# Patient Record
Sex: Male | Born: 1965 | Race: White | Hispanic: No | Marital: Married | Smoking: Current every day smoker
Health system: Southern US, Community
[De-identification: ages and names within clinical notes are randomized; demographics above are authoritative.]

## PROBLEM LIST (undated history)

## (undated) DIAGNOSIS — F419 Anxiety disorder, unspecified: Secondary | ICD-10-CM

## (undated) DIAGNOSIS — K219 Gastro-esophageal reflux disease without esophagitis: Secondary | ICD-10-CM

## (undated) DIAGNOSIS — F32A Depression, unspecified: Secondary | ICD-10-CM

## (undated) DIAGNOSIS — I10 Essential (primary) hypertension: Secondary | ICD-10-CM

## (undated) DIAGNOSIS — E785 Hyperlipidemia, unspecified: Secondary | ICD-10-CM

## (undated) DIAGNOSIS — F329 Major depressive disorder, single episode, unspecified: Secondary | ICD-10-CM

## (undated) DIAGNOSIS — R45851 Suicidal ideations: Secondary | ICD-10-CM

## (undated) DIAGNOSIS — G894 Chronic pain syndrome: Secondary | ICD-10-CM

## (undated) HISTORY — DX: Chronic pain syndrome: G89.4

## (undated) HISTORY — DX: Anxiety disorder, unspecified: F41.9

## (undated) HISTORY — DX: Gastro-esophageal reflux disease without esophagitis: K21.9

## (undated) HISTORY — PX: OTHER SURGICAL HISTORY: SHX169

## (undated) HISTORY — DX: Major depressive disorder, single episode, unspecified: F32.9

## (undated) HISTORY — DX: Hyperlipidemia, unspecified: E78.5

## (undated) HISTORY — DX: Depression, unspecified: F32.A

## (undated) HISTORY — DX: Essential (primary) hypertension: I10

## (undated) HISTORY — DX: Suicidal ideations: R45.851

---

## 1998-11-01 ENCOUNTER — Ambulatory Visit (HOSPITAL_COMMUNITY): Admission: RE | Admit: 1998-11-01 | Discharge: 1998-11-01 | Payer: Self-pay | Admitting: Internal Medicine

## 1998-11-01 ENCOUNTER — Encounter: Payer: Self-pay | Admitting: Internal Medicine

## 1999-05-04 ENCOUNTER — Encounter: Payer: Self-pay | Admitting: *Deleted

## 1999-05-04 ENCOUNTER — Emergency Department (HOSPITAL_COMMUNITY): Admission: EM | Admit: 1999-05-04 | Discharge: 1999-05-04 | Payer: Self-pay | Admitting: Podiatry

## 2000-03-27 ENCOUNTER — Encounter: Payer: Self-pay | Admitting: Emergency Medicine

## 2000-03-27 ENCOUNTER — Inpatient Hospital Stay (HOSPITAL_COMMUNITY): Admission: EM | Admit: 2000-03-27 | Discharge: 2000-03-31 | Payer: Self-pay | Admitting: Emergency Medicine

## 2000-03-28 ENCOUNTER — Encounter: Payer: Self-pay | Admitting: Orthopedic Surgery

## 2000-04-09 ENCOUNTER — Ambulatory Visit (HOSPITAL_COMMUNITY): Admission: RE | Admit: 2000-04-09 | Discharge: 2000-04-09 | Payer: Self-pay | Admitting: Orthopedic Surgery

## 2000-10-04 ENCOUNTER — Encounter: Admission: RE | Admit: 2000-10-04 | Discharge: 2001-01-02 | Payer: Self-pay | Admitting: Anesthesiology

## 2000-11-01 ENCOUNTER — Encounter: Payer: Self-pay | Admitting: Internal Medicine

## 2000-11-01 ENCOUNTER — Ambulatory Visit (HOSPITAL_COMMUNITY): Admission: RE | Admit: 2000-11-01 | Discharge: 2000-11-01 | Payer: Self-pay | Admitting: Internal Medicine

## 2000-12-26 ENCOUNTER — Inpatient Hospital Stay (HOSPITAL_COMMUNITY): Admission: EM | Admit: 2000-12-26 | Discharge: 2000-12-29 | Payer: Self-pay | Admitting: *Deleted

## 2001-01-09 ENCOUNTER — Encounter: Admission: RE | Admit: 2001-01-09 | Discharge: 2001-01-19 | Payer: Self-pay | Admitting: Anesthesiology

## 2002-07-28 ENCOUNTER — Encounter: Payer: Self-pay | Admitting: Emergency Medicine

## 2002-07-28 ENCOUNTER — Emergency Department (HOSPITAL_COMMUNITY): Admission: EM | Admit: 2002-07-28 | Discharge: 2002-07-28 | Payer: Self-pay | Admitting: Emergency Medicine

## 2003-06-10 ENCOUNTER — Encounter: Admission: RE | Admit: 2003-06-10 | Discharge: 2003-09-08 | Payer: Self-pay | Admitting: Anesthesiology

## 2004-01-13 ENCOUNTER — Ambulatory Visit (HOSPITAL_COMMUNITY): Admission: RE | Admit: 2004-01-13 | Discharge: 2004-01-13 | Payer: Self-pay | Admitting: Family Medicine

## 2004-05-02 ENCOUNTER — Ambulatory Visit: Payer: Self-pay | Admitting: Internal Medicine

## 2004-06-20 ENCOUNTER — Ambulatory Visit: Payer: Self-pay | Admitting: Internal Medicine

## 2004-07-01 ENCOUNTER — Ambulatory Visit: Payer: Self-pay | Admitting: Gastroenterology

## 2004-07-20 ENCOUNTER — Ambulatory Visit: Payer: Self-pay | Admitting: Gastroenterology

## 2004-12-05 ENCOUNTER — Ambulatory Visit: Payer: Self-pay | Admitting: Internal Medicine

## 2004-12-29 ENCOUNTER — Ambulatory Visit (HOSPITAL_COMMUNITY): Admission: RE | Admit: 2004-12-29 | Discharge: 2004-12-29 | Payer: Self-pay | Admitting: Internal Medicine

## 2006-03-15 ENCOUNTER — Ambulatory Visit: Payer: Self-pay | Admitting: Internal Medicine

## 2006-03-15 LAB — CONVERTED CEMR LAB
ALT: 17 units/L (ref 0–40)
Albumin: 4 g/dL (ref 3.5–5.2)
BUN: 14 mg/dL (ref 6–23)
Bacteria, U Microscopic: NEGATIVE /hpf
Chloride: 103 meq/L (ref 96–112)
Chol/HDL Ratio, serum: 6.5
Cholesterol: 197 mg/dL (ref 0–200)
Creatinine, Ser: 1.1 mg/dL (ref 0.4–1.5)
Eosinophil percent: 2.1 % (ref 0.0–5.0)
Epithelial cells, urine: NEGATIVE /lpf
Hemoglobin: 14.1 g/dL (ref 13.0–17.0)
Ketones, ur: NEGATIVE mg/dL
LDL DIRECT: 127.9 mg/dL
Leukocytes, UA: NEGATIVE
Lymphocytes Relative: 41.5 % (ref 12.0–46.0)
MCHC: 33.8 g/dL (ref 30.0–36.0)
Monocytes Absolute: 0.8 10*3/uL — ABNORMAL HIGH (ref 0.2–0.7)
Mucus, UA: NEGATIVE
Neutrophils Relative %: 47.4 % (ref 43.0–77.0)
Nitrite: NEGATIVE
PSA: 1.03 ng/mL (ref 0.10–4.00)
Potassium: 4.3 meq/L (ref 3.5–5.1)
RBC: 4.64 M/uL (ref 4.22–5.81)
RDW: 12.4 % (ref 11.5–14.6)
Sodium: 137 meq/L (ref 135–145)
TSH: 2.77 microintl units/mL (ref 0.35–5.50)
Total Bilirubin: 0.5 mg/dL (ref 0.3–1.2)
Total Protein, Urine: NEGATIVE mg/dL
Triglyceride fasting, serum: 216 mg/dL (ref 0–149)
Urine Glucose: NEGATIVE mg/dL
Urobilinogen, UA: 0.2 (ref 0.0–1.0)
WBC number, urine, microscopy: NONE SEEN /hpf

## 2006-08-30 ENCOUNTER — Ambulatory Visit: Payer: Self-pay | Admitting: Internal Medicine

## 2006-10-30 ENCOUNTER — Ambulatory Visit: Payer: Self-pay | Admitting: Internal Medicine

## 2006-10-30 LAB — CONVERTED CEMR LAB
ALT: 14 units/L (ref 0–40)
Albumin: 4.3 g/dL (ref 3.5–5.2)
Alkaline Phosphatase: 76 units/L (ref 39–117)
Bacteria, UA: NEGATIVE
Basophils Absolute: 0.1 10*3/uL (ref 0.0–0.1)
Basophils Relative: 0.8 % (ref 0.0–1.0)
Bilirubin Urine: NEGATIVE
Calcium: 9.3 mg/dL (ref 8.4–10.5)
Cholesterol: 167 mg/dL (ref 0–200)
Creatinine, Ser: 1 mg/dL (ref 0.4–1.5)
Eosinophils Absolute: 0.1 10*3/uL (ref 0.0–0.6)
Glucose, Bld: 121 mg/dL — ABNORMAL HIGH (ref 70–99)
HCT: 40.6 % (ref 39.0–52.0)
HDL: 37.7 mg/dL — ABNORMAL LOW (ref >39.0)
Ketones, ur: NEGATIVE mg/dL
Lymphocytes Relative: 28.4 % (ref 12.0–46.0)
Neutro Abs: 8.8 10*3/uL — ABNORMAL HIGH (ref 1.4–7.7)
Neutrophils Relative %: 64.3 % (ref 43.0–77.0)
Nitrite: NEGATIVE
PSA: 0.83 ng/mL (ref 0.10–4.00)
Platelets: 301 10*3/uL (ref 150–400)
Potassium: 3.5 meq/L (ref 3.5–5.1)
RBC: 4.58 M/uL (ref 4.22–5.81)
Sodium: 134 meq/L — ABNORMAL LOW (ref 135–145)
Total CHOL/HDL Ratio: 4.4
Urine Glucose: NEGATIVE mg/dL
VLDL: 14 mg/dL (ref 0–40)
WBC, UA: NONE SEEN cells/hpf

## 2006-11-05 ENCOUNTER — Ambulatory Visit: Payer: Self-pay | Admitting: Internal Medicine

## 2006-11-05 LAB — CONVERTED CEMR LAB
Basophils Absolute: 0 10*3/uL (ref 0.0–0.1)
Bilirubin Urine: NEGATIVE
CO2: 30 meq/L (ref 19–32)
Calcium: 8.7 mg/dL (ref 8.4–10.5)
Eosinophils Absolute: 0.1 10*3/uL (ref 0.0–0.6)
Eosinophils Relative: 1 % (ref 0.0–5.0)
GFR calc Af Amer: 136 mL/min
GFR calc non Af Amer: 112 mL/min
MCHC: 34.6 g/dL (ref 30.0–36.0)
Monocytes Relative: 5.1 % (ref 3.0–11.0)
Neutro Abs: 5.9 10*3/uL (ref 1.4–7.7)
RBC: 4.66 M/uL (ref 4.22–5.81)
RDW: 13.2 % (ref 11.5–14.6)
Urine Glucose: NEGATIVE mg/dL
WBC: 9.3 10*3/uL (ref 4.5–10.5)

## 2006-11-06 ENCOUNTER — Encounter: Payer: Self-pay | Admitting: Internal Medicine

## 2007-03-12 ENCOUNTER — Encounter: Payer: Self-pay | Admitting: Internal Medicine

## 2007-03-12 ENCOUNTER — Ambulatory Visit: Payer: Self-pay | Admitting: Internal Medicine

## 2007-03-12 DIAGNOSIS — E119 Type 2 diabetes mellitus without complications: Secondary | ICD-10-CM

## 2007-03-12 DIAGNOSIS — F329 Major depressive disorder, single episode, unspecified: Secondary | ICD-10-CM

## 2007-03-12 DIAGNOSIS — F32A Depression, unspecified: Secondary | ICD-10-CM | POA: Insufficient documentation

## 2007-03-12 DIAGNOSIS — I10 Essential (primary) hypertension: Secondary | ICD-10-CM | POA: Insufficient documentation

## 2007-03-12 DIAGNOSIS — G4700 Insomnia, unspecified: Secondary | ICD-10-CM | POA: Insufficient documentation

## 2007-03-12 DIAGNOSIS — F411 Generalized anxiety disorder: Secondary | ICD-10-CM

## 2007-03-12 DIAGNOSIS — K219 Gastro-esophageal reflux disease without esophagitis: Secondary | ICD-10-CM | POA: Insufficient documentation

## 2007-03-12 DIAGNOSIS — E785 Hyperlipidemia, unspecified: Secondary | ICD-10-CM

## 2007-03-12 LAB — CONVERTED CEMR LAB
BUN: 5 mg/dL — ABNORMAL LOW (ref 6–23)
Calcium: 9.8 mg/dL (ref 8.4–10.5)
Chloride: 107 meq/L (ref 96–112)
Creatinine, Ser: 0.8 mg/dL (ref 0.4–1.5)
GFR calc non Af Amer: 112 mL/min
HDL: 29.8 mg/dL — ABNORMAL LOW (ref >39.0)
Hgb A1c MFr Bld: 5.9 % (ref 4.6–6.0)
Sodium: 145 meq/L (ref 135–145)
Total CHOL/HDL Ratio: 9.4
Triglycerides: 186 mg/dL — ABNORMAL HIGH (ref 0–149)
VLDL: 37 mg/dL (ref 0–40)

## 2007-08-12 ENCOUNTER — Ambulatory Visit: Payer: Self-pay | Admitting: Family Medicine

## 2007-10-11 ENCOUNTER — Ambulatory Visit: Payer: Self-pay | Admitting: Family Medicine

## 2007-10-18 ENCOUNTER — Ambulatory Visit: Payer: Self-pay | Admitting: Family Medicine

## 2007-10-24 ENCOUNTER — Ambulatory Visit: Payer: Self-pay | Admitting: Family Medicine

## 2007-11-15 ENCOUNTER — Ambulatory Visit: Payer: Self-pay | Admitting: Family Medicine

## 2007-12-06 ENCOUNTER — Ambulatory Visit: Payer: Self-pay | Admitting: Family Medicine

## 2008-02-06 ENCOUNTER — Ambulatory Visit: Payer: Self-pay | Admitting: Family Medicine

## 2008-08-14 ENCOUNTER — Ambulatory Visit: Payer: Self-pay | Admitting: Family Medicine

## 2008-12-25 ENCOUNTER — Ambulatory Visit: Payer: Self-pay | Admitting: Family Medicine

## 2009-09-06 ENCOUNTER — Ambulatory Visit: Payer: Self-pay | Admitting: Family Medicine

## 2010-06-12 ENCOUNTER — Encounter: Payer: Self-pay | Admitting: Internal Medicine

## 2010-06-23 ENCOUNTER — Ambulatory Visit: Admit: 2010-06-23 | Payer: Self-pay | Admitting: Internal Medicine

## 2010-06-23 ENCOUNTER — Ambulatory Visit: Payer: Self-pay | Admitting: Internal Medicine

## 2010-07-04 ENCOUNTER — Encounter: Payer: Self-pay | Admitting: Internal Medicine

## 2010-07-04 ENCOUNTER — Ambulatory Visit (INDEPENDENT_AMBULATORY_CARE_PROVIDER_SITE_OTHER): Payer: 59 | Admitting: Internal Medicine

## 2010-07-04 ENCOUNTER — Other Ambulatory Visit: Payer: Self-pay | Admitting: Internal Medicine

## 2010-07-04 DIAGNOSIS — E119 Type 2 diabetes mellitus without complications: Secondary | ICD-10-CM

## 2010-07-04 DIAGNOSIS — Z0000 Encounter for general adult medical examination without abnormal findings: Secondary | ICD-10-CM

## 2010-07-04 DIAGNOSIS — IMO0001 Reserved for inherently not codable concepts without codable children: Secondary | ICD-10-CM

## 2010-07-04 DIAGNOSIS — R5383 Other fatigue: Secondary | ICD-10-CM

## 2010-07-04 DIAGNOSIS — R5381 Other malaise: Secondary | ICD-10-CM | POA: Insufficient documentation

## 2010-07-04 DIAGNOSIS — E785 Hyperlipidemia, unspecified: Secondary | ICD-10-CM

## 2010-07-04 DIAGNOSIS — I10 Essential (primary) hypertension: Secondary | ICD-10-CM

## 2010-07-04 DIAGNOSIS — L989 Disorder of the skin and subcutaneous tissue, unspecified: Secondary | ICD-10-CM | POA: Insufficient documentation

## 2010-07-04 DIAGNOSIS — L84 Corns and callosities: Secondary | ICD-10-CM | POA: Insufficient documentation

## 2010-07-04 LAB — CBC WITH DIFFERENTIAL/PLATELET
Basophils Relative: 0.6 % (ref 0.0–3.0)
Eosinophils Absolute: 0.2 10*3/uL (ref 0.0–0.7)
Eosinophils Relative: 1.6 % (ref 0.0–5.0)
Hemoglobin: 14.6 g/dL (ref 13.0–17.0)
Lymphocytes Relative: 24.5 % (ref 12.0–46.0)
Lymphs Abs: 3.1 10*3/uL (ref 0.7–4.0)
Monocytes Relative: 6.4 % (ref 3.0–12.0)
Neutro Abs: 8.5 10*3/uL — ABNORMAL HIGH (ref 1.4–7.7)
Neutrophils Relative %: 66.9 % (ref 43.0–77.0)
Platelets: 307 10*3/uL (ref 150.0–400.0)
RDW: 13 % (ref 11.5–14.6)

## 2010-07-04 LAB — URINALYSIS, ROUTINE W REFLEX MICROSCOPIC
Ketones, ur: NEGATIVE
Leukocytes, UA: NEGATIVE
Urobilinogen, UA: 0.2 (ref 0.0–1.0)
pH: 6 (ref 5.0–8.0)

## 2010-07-04 LAB — BASIC METABOLIC PANEL
BUN: 8 mg/dL (ref 6–23)
Potassium: 3.7 mEq/L (ref 3.5–5.1)

## 2010-07-04 LAB — MICROALBUMIN / CREATININE URINE RATIO
Microalb Creat Ratio: 2.7 mg/g (ref 0.0–30.0)
Microalb, Ur: 3.4 mg/dL — ABNORMAL HIGH (ref 0.0–1.9)

## 2010-07-04 LAB — LIPID PANEL
HDL: 35.5 mg/dL — ABNORMAL LOW (ref >39.00)
Total CHOL/HDL Ratio: 5
Triglycerides: 357 mg/dL — ABNORMAL HIGH (ref 0.0–149.0)

## 2010-07-04 LAB — HEPATIC FUNCTION PANEL
Bilirubin, Direct: 0.1 mg/dL (ref 0.0–0.3)
Total Bilirubin: 0.4 mg/dL (ref 0.3–1.2)

## 2010-07-04 LAB — PSA: PSA: 1.13 ng/mL (ref 0.10–4.00)

## 2010-07-04 LAB — HEMOGLOBIN A1C: Hgb A1c MFr Bld: 6.4 % (ref 4.6–6.5)

## 2010-07-04 LAB — LDL CHOLESTEROL, DIRECT: Direct LDL: 109.3 mg/dL

## 2010-07-04 LAB — TSH: TSH: 1.47 u[IU]/mL (ref 0.35–5.50)

## 2010-07-05 LAB — CONVERTED CEMR LAB: Testosterone: 271.21 ng/dL (ref 250–890)

## 2010-07-19 NOTE — Assessment & Plan Note (Signed)
Summary: PER [facility]--[facility]-LAST APPT W/DR JWJ:  2008--[facility]   Vital Signs:  Patient profile:   45 year old male Height:      73 inches Weight:      202 pounds BMI:     26.75 O2 Sat:      97 % on Room air Temp:     98.3 degrees F oral Pulse rate:   77 / minute BP sitting:   102 / 62  (left arm) Cuff size:   large  Vitals Entered By: Zella Ball Ewing CMA Duncan Dull) (July 04, 2010 2:40 PM)  O2 Flow:  Room air  Preventive Care Screening     decliens tetanus, flu today  CC: All over body aches and fatigue, lump on right arm/RE   CC:  All over body aches and fatigue and lump on right arm/RE.  History of Present Illness: here to re-stablish with wellness and f/u since being gone since 2008 - wife present as well who helps elaborate on his complaints  Pt  c/o gen'd pain "allover", hurts all the time, exhausted all the time,  No fever, wt loss, night sweats, loss of appetite or other constitutional symptoms  Pain worst to shoulder , lower back, legs and hands. Works for [employer] with dialy physical demands but no realtively specific area or joint.   Pt denies CP, worsening sob, doe, wheezing, orthopnea, pnd, worsening LE edema, palps, dizziness or syncope  Pt denies polydipsia, polyuria,  Overall good compliance with meds, trying to follow low chol, DM diet, wt stable, little excercise however  Denies worsening depressive symptoms, suicidal ideation, or panic, though has hx of depression, with anxiety.  Has non resotrative sleep.   Has fatigue but little to no snoring , no AM headaches, and no daytime somnolence.  Has difficulty getting  to sleep..  No fever, wt loss, night sweats, loss of appetite or other constitutional symptoms  Pt states good ability with ADL's, low fall risk, home safety reviewed and adequate, no significant change in hearing or vision, trying to follow lower chol diet, and occasionally active only with regular excercise.   Does also have lump to the back of the  right arm, not increeased in size edfor over 6 mo. also requetsts referral to podiatry - due to pain with marked callous to the feet.  Also with  ED symtpoms worse over the past 6 mo as well - ? stress related, no loss of secondary sex characteristics and libido somewhat low as well.   Pt denies new neuro symptoms such as headache, facial or extremity weakness    Preventive Screening-Counseling & Management      Drug Use:  no.    Problems Prior to Update: 1)  Fatigue  (ICD-780.79) 2)  Preventive Health Care  (ICD-V70.0) 3)  Corns and Callosities  (ICD-700) 4)  Skin Lesion  (ICD-709.9) 5)  Fibromyalgia  (ICD-729.1) 6)  Symptom, Insomnia Nos  (ICD-780.52) 7)  Hyperlipidemia  (ICD-272.4) 8)  Gerd  (ICD-530.81) 9)  Depression  (ICD-311) 10)  Anxiety  (ICD-300.00) 11)  Diabetes Mellitus, Type II  (ICD-250.00) 12)  Hypertension  (ICD-401.9)  Medications Prior to Update: 1)  Fluoxetine Hcl 40 Mg  Caps (Fluoxetine Hcl) .Marland Kitchen.. 1 By Mouth Qd 2)  Lisinopril 20 Mg  Tabs (Lisinopril) .Marland Kitchen.. 1 By Mouth Qd 3)  Zolpidem Tartrate 10 Mg  Tabs (Zolpidem Tartrate) .Marland Kitchen.. 1 Po At Bedtime Prn 4)  Advil 200 Mg  Tabs (Ibuprofen) .Marland Kitchen.. 1 By Mouth Once Daily  Prn 5)  Prilosec 20 Mg  Cpdr (Omeprazole) .Marland Kitchen.. 1 By Mouth Qd 6)  Bupropion Hcl 300 Mg  Tb24 (Bupropion Hcl) .Marland Kitchen.. 1 By Mouth Qd  Current Medications (verified): 1)  Lisinopril 20 Mg  Tabs (Lisinopril) .Marland Kitchen.. 1 By Mouth Once Daily 2)  Advil 200 Mg  Tabs (Ibuprofen) .Marland Kitchen.. 1 By Mouth Once Daily Prn 3)  Prilosec 20 Mg  Cpdr (Omeprazole) .Marland Kitchen.. 1 By Mouth Once Daily 4)  Cymbalta 60 Mg Cpep (Duloxetine Hcl) .Marland Kitchen.. 1 By Mouth Once Daily  Allergies (verified): 1)  ! Methadone Hcl (Methadone Hcl)  Past History:  Past Medical History: Last updated: 03/12/2007 Hypertension Diabetes mellitus, type II Anxiety Depression GERD Hyperlipidemia h/o suicidal ideation with hospn chronic pain syndrome  Past Surgical History: Last updated: 03/12/2007 h/o leg fracture - s/p  right tibial surgury x 3  Family History: Last updated: 03/12/2007 Family History Hypertension  Social History: Last updated: 07/04/2010 Current Smoker Alcohol use-yes - rare Drug use-no Married  Risk Factors: Smoking Status: current (03/12/2007)  Social History: Current Smoker Alcohol use-yes - rare Drug use-no Married Drug Use:  no  Review of Systems  The patient denies anorexia, fever, vision loss, decreased hearing, hoarseness, chest pain, syncope, dyspnea on exertion, peripheral edema, prolonged cough, headaches, hemoptysis, abdominal pain, melena, hematochezia, severe indigestion/heartburn, hematuria, muscle weakness, suspicious skin lesions, difficulty walking, depression, unusual weight change, abnormal bleeding, enlarged lymph nodes, and angioedema.         all otherwise negative per pt -    Physical Exam  General:  alert and mild overweight-appearing.   Head:  normocephalic and atraumatic.   Eyes:  vision grossly intact, pupils equal, and pupils round.   Ears:  R ear normal and L ear normal.   Nose:  no external deformity and no nasal discharge.   Mouth:  no gingival abnormalities and pharynx pink and moist.   Neck:  supple and no masses.   Lungs:  normal respiratory effort and normal breath sounds.   Heart:  normal rate and regular rhythm.   Abdomen:  soft, non-tender, and normal bowel sounds.   Genitalia:  deferred  Msk:  no joint tenderness and no joint swelling and FROM, but does have  diffuse nonspecific tenderness to upper adn lower back mult trigger type points Extremities:  no edema, no erythema  Neurologic:  cranial nerves II-XII intact and strength normal in all extremities.   Skin:  color normal and no rashes.  , does has right post mid arm subq firm lesion , mobile, nontender c/w lesion such as dermatofibroma  also with callout to right foot, tender but not swollen, erythema, d/c Psych:  not depressed appearing and moderately anxious.      Impression & Recommendations:  Problem # 1:  Preventive Health Care (ICD-V70.0) Overall doing well, age appropriate education and counseling updated, referral for preventive services and immunizations addressed, dietary counseling and smoking status adressed , most recent labs reviewed, ecg reviewed I have personally reviewed and have noted 1.The patient's medical and social history 2.Their use of alcohol, tobacco or illicit drugs 3.Their current medications and supplements 4. Functional ability including ADL's, fall risk, home safety risk, hearing & visual impairment  5.Diet and physical activities 6.Evidence for depression or mood disorders The patients weight, height, BMI  have been recorded in the chart I have made referrals, counseling and provided education to the patient based review of the above  Orders: TLB-BMP (Basic Metabolic Panel-BMET) (80048-METABOL) TLB-CBC Platelet - w/Differential (85025-CBCD) TLB-Hepatic/Liver  Function Pnl (80076-HEPATIC) TLB-Lipid Panel (80061-LIPID) TLB-PSA (Prostate Specific Antigen) (84153-PSA) TLB-Udip ONLY (81003-UDIP) TLB-TSH (Thyroid Stimulating Hormone) (84443-TSH)  Problem # 2:  FIBROMYALGIA (ICD-729.1)  His updated medication list for this problem includes:    Advil 200 Mg Tabs (Ibuprofen) .Marland Kitchen... 1 by mouth once daily prn only 2 wks but typical symtpoms  - ? FMS vs other more transient MSK/myalgias - for cymbalta trial as anxiety/depression also are factors- treat as above, f/u any worsening signs or symptoms   Problem # 3:  SKIN LESION (ICD-709.9) right post arm - c/w  benign lesion - ok to follow  Problem # 4:  CORNS AND CALLOSITIES (ICD-700)  refer to podiatry, likely needs minor procedure  Orders: Podiatry Referral (Podiatry)  Problem # 5:  FATIGUE (ICD-780.79) exam benign, to check labs below; follow with expectant management  Orders: T-Testosterone; Total [phone])  Problem # 6:  DIABETES MELLITUS, TYPE II  (ICD-250.00)  His updated medication list for this problem includes:    Lisinopril 20 Mg Tabs (Lisinopril) .Marland Kitchen... 1 by mouth once daily asympt - Pt to cont DM diet, excercise, wt control efforts; to check labs today   Orders: TLB-A1C / Hgb A1C (Glycohemoglobin) (83036-A1C) TLB-Microalbumin/Creat Ratio, Urine (82043-MALB)  Labs Reviewed: Creat: 0.8 (03/12/2007)    Reviewed HgBA1c results: 5.9 (03/12/2007)  6.2 (11/05/2006)  Problem # 7:  HYPERTENSION (ICD-401.9)  His updated medication list for this problem includes:    Lisinopril 20 Mg Tabs (Lisinopril) .Marland Kitchen... 1 by mouth once daily  BP today: 102/62 Prior BP: 151/83 (03/12/2007)  Labs Reviewed: K+: 3.9 (03/12/2007) Creat: : 0.8 (03/12/2007)   Chol: 280 (03/12/2007)   HDL: 29.8 (03/12/2007)   LDL: DEL (03/12/2007)   TG: 186 (03/12/2007) stable overall by hx and exam, ok to continue meds/tx as is   Complete Medication List: 1)  Lisinopril 20 Mg Tabs (Lisinopril) .Marland Kitchen.. 1 by mouth once daily 2)  Advil 200 Mg Tabs (Ibuprofen) .Marland Kitchen.. 1 by mouth once daily prn 3)  Prilosec 20 Mg Cpdr (Omeprazole) .Marland Kitchen.. 1 by mouth once daily 4)  Cymbalta 60 Mg Cpep (Duloxetine hcl) .Marland Kitchen.. 1 by mouth once daily  Patient Instructions: 1)  Please take all new medications as prescribed - start with 30 mg per day of the cymbalta fo r 1 wk, then 60 mg per day after that 2)  Continue all previous medications as before this visit  3)  You will be contacted about the referral(s) to: podiatry 4)  Please go to the Lab in the basement for your blood and/or urine tests today  5)  Please call the number on the [facility] Card for results of your testing  6)  You are given the refills today 7)  Please schedule a follow-up appointment in 6 months, or sooner if needed Prescriptions: CYMBALTA 60 MG CPEP (DULOXETINE HCL) 1 by mouth once daily  #90 x 3   Entered and Authorized by:   Corwin Levins MD   Signed by:   Corwin Levins MD on 07/04/2010   Method used:   Print then Give to  Patient   RxID:   [id number] CYMBALTA 30 MG CPEP (DULOXETINE HCL) 1 by mouth once daily  #30 x 0   Entered and Authorized by:   Corwin Levins MD   Signed by:   Corwin Levins MD on 07/04/2010   Method used:   Print then Give to Patient   RxID:   [id number] PRILOSEC 20 MG  CPDR (OMEPRAZOLE) 1 by mouth  once daily  #90 x 3   Entered and Authorized by:   Corwin Levins MD   Signed by:   Corwin Levins MD on 07/04/2010   Method used:   Print then Give to Patient   RxID:   [id number] LISINOPRIL 20 MG  TABS (LISINOPRIL) 1 by mouth once daily  #90 x 3   Entered and Authorized by:   Corwin Levins MD   Signed by:   Corwin Levins MD on 07/04/2010   Method used:   Print then Give to Patient   RxID:   [id number]    Orders Added: 1)  T-Testosterone; Total [phone] 2)  TLB-BMP (Basic Metabolic Panel-BMET) [80048-METABOL] 3)  TLB-CBC Platelet - w/Differential [85025-CBCD] 4)  TLB-Hepatic/Liver Function Pnl [80076-HEPATIC] 5)  TLB-Lipid Panel [80061-LIPID] 6)  TLB-PSA (Prostate Specific Antigen) [84153-PSA] 7)  TLB-Udip ONLY [81003-UDIP] 8)  TLB-TSH (Thyroid Stimulating Hormone) [84443-TSH] 9)  TLB-A1C / Hgb A1C (Glycohemoglobin) [83036-A1C] 10)  TLB-Microalbumin/Creat Ratio, Urine [82043-MALB] 11)  Podiatry Referral [Podiatry] 12)  New Patient 40-64 years [99386] 8)  New Patient Level IV [01027]

## 2010-10-07 NOTE — H&P (Signed)
[facility]  Patient:    Peter Zimmerman, Peter Zimmerman                   MRN: [id number] Adm. Date:  [id number] Attending:  Thyra Breed CC:         [name], M.D.  Tamsen Roers, M.D.   History and Physical  NEW PATIENT EVALUATION  HISTORY OF PRESENT ILLNESS:  Arkel Cartwright is a very pleasant 45 year old who was sent to [place] by Dr. [name] for evaluation and treatment of right foot pain.  The patient stated that he was in his usual state of health until November 2001, when he fell off of a five foot ladder and sustained fractures of his right tibia.  He has undergone extensive surgical reconstruction of the tibia with his most recent surgery being about four weeks ago for an arthroscopy to remove bone fragments.  Since the fall, he has had decreased sensation over the right lower extremity from the knee down and developed foot swelling, intermittent sweating, and increased heat of the right ankle.  He has noted changes in his skin.  The right foot is always darker than the left. He notes that he has a sense like there is broken jagged glass pressing in on his heel whenever he tries to weight bear on it.  His knee is improved and feels distinctly different from the foot discomfort.  He has been treated with opiates, initially with OxyContin which oversedated him and then Tylox and then Percocet.  He has also been treated for migraine headaches with Doxepin up to 300 mg per day, Carafate, ibuprofen, Zanaflex, diazepam, and Norvasc by Dr. Ruthe Mannan.  He was just recently started on the Zanaflex.  The patient describes his pain as a sense that he has jagged bones being driven into sole of his foot.  This is made worse by weight-bearing or light touch.  CURRENT MEDICATIONS:  Doxepin, Carafate, ibuprofen, Zanaflex, diazepam, Norvasc, and Percocet.  He is taking up to six tablets per day.  ALLERGIES:  No known drug allergies.  FAMILY HISTORY:   Positive for coronary artery disease, lung cancer, diabetes mellitus, stroke, and hypertension.  PAST SURGICAL HISTORY:  Significant for the reconstructive surgery on his right lower extremity for tibial fractures.  ACTIVE MEDICAL PROBLEMS:  Migraine and headaches for which he sees Dr. Ruthe Mannan.  SOCIAL HISTORY:  The patient is a one pack per day smoker.  He does not drink alcohol.  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  PHYSICAL EXAMINATION:  VITAL SIGNS:  Blood pressure 159/97, heart rate 107, respiratory rate 19, O2 saturation 97%.  PAIN LEVEL:  His pain level is 8/10.  GENERAL:  This is a pleasant male in no acute distress.  HEENT:  HEAD: Normocephalic, atraumatic with septal deviation to the right. EYES: Extraocular movements intact.  Conjunctivae and sclerae clear. OROPHARYNX:  Free of lesions.  NECK:  Supple, without lymphadenopathy.  Carotids are 2+ and symmetric, without bruits.  LUNGS:  Clear.  HEART:  Regular rate and rhythm.  ABDOMEN:  Bowel sounds are present.  GENITALIA/RECTAL:  Not performed.  BACK:  Negative straight leg raising.  EXTREMITIES:  Upper extremities demonstrate radial pulses 2+ and symmetric, with intact range of motion of the shoulders.  Lower extremities demonstrated reddish discoloration of the right foot with increased warmth, especially around the ankle and diffuse edema of the foot with shininess to the skin. There is no increased  hair growth.  He shows well healed surgical scars.  NEUROLOGICAL:  The patient is oriented x 4.  Cranial nerves 2-12 are grossly intact.  Deep tendon reflexes were symmetric in the upper extremities, right knee, and ankle.  Intact at the left ankle and knee.  Sensory examination reveals attenuated pinprick to the knee level on the right side, as well as vibratory sense.  There is sharp pain on pressure application over the sole of the right foot.  Coordination was  grossly intact.  Muscle bulk and tone was intact.  IMPRESSION: 1. Right lower extremity pain which looks likes complex regional pain    syndrome, likely secondary to his fracture with associated neuropathic    pain with suspected peroneal neuropathy. 2. Migraine headaches per Dr. Ruthe Mannan. 3. Hypertension per Dr. Ruthe Mannan. 4. Remote history of dislocated right shoulder.  DISPOSITION: 1. Continue on current medications, except change over to Duragesic 25 mcg    patch one every three days with Percocet 5/325 1 p.o. to 2 p.o. q.6h.    p.r.n., #100. 2. I discussed the possibility of lumbar sympathetic block and he is open to    this.  We will see how he does with the opiates initially. 3. He is to follow up in ten weeks to consider lumbar sympathetic blocks.  REVIEW OF SYSTEMS:  GENERAL:  Sweat.  HEAD: Significant for migraine headaches.  EYES: Significant for right eye trauma while in high school, which he states is the onset of his headaches.  At that time, he apparently sustained retinal damage.  NOSE, MOUTH, THROAT: Significant for cystic acne over nose.  EARS: Negative.  PULMONARY: Negative.  CARDIOVASCULAR: Significant for hypertension.  GI: Significant for mild gastroesophageal reflux disease and constipation secondary to opiates.  GU: Negative.  MUSCULOSKELETAL: See HPI.  NEUROLOGICAL: See HPI.  No history of seizure or stroke.  HEMATOLOGIC: Negative.  CUTANEOUS: See above.  Significant for dry skin and reddish discoloration of the foot.  ENDOCRINE: Negative.  PSYCHIATRIC: Positive for depression.  DD:  10/05/00 TD:  10/06/00 Job: [id number]

## 2010-10-07 NOTE — Group Therapy Note (Signed)
SUBJECTIVE:  Peter Zimmerman comes to [facility] today.  I  evaluated him via the health and history form and 14 point review of systems  are reviewed, extensive records, progress to date and overall directed care  approach.  He is a pleasant 45 year old who has had substantial escalation  in pain in the right lower extremity after a fall from a ladder November  2000, with a fractured leg, it really sounds more like shattered leg.  He  had extensive reconstruction, and post reconstruction was being treated for  elements of CRPS.  He described some pseudomotor changes with modest  __________ that occasionally he has discoloration and edema, and pain with  most things touching his foot, but is easily able to wear a shoe and sock.  He has also been followed for control of his pain in a multimodality sense.  He has had some blocks, which sounds like a __________ injection, or  something along these lines, and maybe a lumbar sympathetic block.  He is  unsure.  He is also unsure if these helped him much.  He is made worse by  walking, bending and working, improved with medication, tests include x-ray,  nerve conduction studies, MRI.  States no wish to harm self or others.  Fourteen-point review of systems health and history form reviewed.  Medications attached to the chart.  He takes 230 mg of Avinza a day and  twelve 5 mg oxycodone a day.  He also up to a month ago was taking 10 mg  Xanax as well.  Of course none of these have helped him a great deal, as if  in fact this is CRPS, this is narcotic resistant, and he does describe some  of these characteristics.   His past medical history remarkable for a tibial reconstruction,  hypertension.  He has also had arthroscopic knee surgery.   His family history remarkable for heart disease, lung disease, cancer,  hypertension, and disability.   He does work full time.  Of note, he appears to be a forthright individual  as he has  actively pursued a job here receiving a fairly high pay scale,  that at $20 an hour with benefits, and is at a new job, very concerned about  losing this position.  He wants to be able to provide for his family and to  avoid disability.   SOCIAL HISTORY:  He is a smoker.  I cautioned.  He is married.  He lives  with his wife and son.  I did a social inventory, he is not in a risk  environment.   Review of systems, family and social history otherwise noncontributory to  the pain problem.   OBJECTIVE:  HEENT is unremarkable.  Chest clear to auscultation and  percussion.  He has a regular rate and rhythm without rub, murmur, or  gallop.  Abdominal exam soft, nontender, no hepatosplenomegaly.  He has  diffuse paralumbar myofascial discomfort primarily myomechanical with  Gaenslen's and Patrick's equivocal.  His right lower extremity reveals scars  from reconstruction well healed, no peri-incisional allodynia.  It is  discolored, more erythematous and slightly edematous than actually  exhibiting typical pseudomotor changes.  Capillary filling is fine, he has  adequate range of motion in his ankle.  He has no other neurological  deficit, motor, sensory, reflex.   IMPRESSION:  1. Probable complex regional pain syndrome lower extremity.  2. Bony pain lower extremity secondary to fracture.  3. Hypertension.  4. Cigarettism.  5. Poor overall health characteristics.   PLAN:  1. He will see his primary care physician for cigarette cessation, as well     as continued management of his health issues.  As I relate to him I do     not think we are going to make any progress forward unless cigarette     cessation occurs, and he is motivated in this regard.  2. He also understands that this is a narcotic resistant issue and I am     going to need to move away from these controlled substances at this     level.  I just do not see any rational reason for him to be on these high     level narcotics  and I caution to driving, operating machinery, and making     important cognitive decisions, and he understands this.  I review patient     care agreement.  I do have a feeling he is a forthright individual and I     do not think I need to drug test him at this visit, but he certainly     understands that is part of our policy, and he will bring his pill     bottles in.  3. He is going to be out of his medicine here pretty soon.  I do not want     him to go through withdrawal, he wants to continue working, and I am     going to go ahead and provide him with his Avinza  he does not need his     oxycodone as he has really cut back on his own, and I will add Klonopin     to blunt any withdrawal blunt effects.  4. I am going to have him see Dr. Pamelia Hoit in the next couple of weeks to     bridge his medications, and I will arrange for him to have a lumbar     sympathetic block.  This should help with decreasing narcotic dependency,     helping [place] move away from these controlled substances and using non-     narcotic medication alternatives.  He has trialed about everything,     including Topamax, Keppra, and other medication alternatives, but I would     entertain topical's DMM and the like.  5. This is an extensive consultation.  We are going to go ahead and accept     his controlled substances, but do so with caution and a plan to move     forward.  He understands I do not want to be deeply involved in these     controlled substances as if he truly has CRPS this is not our long-term     strategy.  He understands the multimodality approach is our best     consideration.  6. I am going to have him see Dr. Daleen Bo also for considerations of     neurostimulation should we move down this route.  If he has a positive     response from a more sympathetic block I would probably trial     radiofrequency neuroablation to the lumbar sympathetic chain, but I would    like to get Dr. Therese Sarah input  as to stimulations benefit as well.   We will see him followup.     Celene Kras, MD   HH/MedQ  D:  06/20/2003 12:18:02  T:  06/20/2003 14:27:08  Job #:  [id number]  cc:   Brantley Stage, M.D.  Fax: [phone]   Loraine Leriche A. Tyrone Sage, M.D.  [facility]  [address], [address]  [place], [place] [address]

## 2010-10-07 NOTE — Procedures (Signed)
[facility]  Patient:    MAGNUM, LUNDE                   MRN: [id number] Proc. Date: 10/31/00 Adm. Date:  [id number] Attending:  Thyra Breed CC:         [name], M.D.                           Procedure Report  PROCEDURE:  IV infusion of lidocaine.  DIAGNOSIS:  Complex regional pain syndrome of the right lower extremity.  INTERVAL HISTORY:  The patients noted overall improvement in function following the lumbar sympathetic block but he continues to have pain which he rates at 8/10. He has burning discomfort with associated swelling and increased warmth of the right foot. He has continued on his OxyContin but requires about 80 mg twice a day. He continues with doxepin, ibuprofen, Cafergot, Norvasc, diazepam and Zanaflex. He does not feel as though the Zanaflex is helping greatly.  In the interim, he has injured his left great toe and it looks as though he may have a broken toe with a lot of contusions over this. He does not want to be seen by an orthopedic surgeon or other physician is possible with regard to this.  PHYSICAL EXAMINATION:  The patients afebrile with vital signs stable, please see flow sheet from procedure note today. He demonstrates increased warmth and pitting edema in the ankle region but not over the dorsum of the right foot. There is increased warmth. His left great toe shows a contusion which extends back over the metatarsal region. He is quite tender to touch.  DESCRIPTION OF PROCEDURE:  After informed consent was obtained, the patient was placed in the semirecumbent position and monitored. I personally administered 600 mg of lidocaine intravenously. His pain level went from 8/10 down to barely perceptible.  CONDITION POST PROCEDURE:  Stable.  DISCHARGE INSTRUCTIONS: 1. Resume previous diet. 2. Limitations in activities per instruction sheet as outlined by my assistant    today. His wife is his driver. 3.  Discontinue Zanaflex. 4. OxyContin 80 mg one p.o. b.i.d. #60 with no refill. 5. Mexiletine 150 mg one p.o. q.p.m. x 7 days and if tolerated one p.o.    b.i.d. #60 with one refill. He was advised of the potential side effects    of this medication in detail. 6. Followup with me in two weeks for repeat IV infusion of lidocaine as we    try to taper up his mexiletine. Hopefully he will be able to tolerate this    but he has had difficulties with heartburn in the past. 7. I encouraged him to use his walking hard sole support for his foot on the    left foot rather than the right foot. DD:  10/31/00 TD:  10/31/00 Job: [id number]

## 2010-10-07 NOTE — Procedures (Signed)
[facility]  Patient:    Peter Zimmerman, Peter Zimmerman                   MRN: [id number] Proc. Date: 11/14/00 Adm. Date:  [id number] Attending:  Thyra Breed CC:         [name], M.D.   Procedure Report  PROCEDURE:  Intravenous infusion of lidocaine.  DIAGNOSIS:  Complex regional pain syndrome of right lower extremity.  ANESTHESIOLOGIST:  Thyra Breed, M.D.  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  Since his last evaluation, he has been back to see Dr. Fernande Boyden associated, Dr. Madelon Lips, and had his left foot casted for the fractures in his foot.  He has not noted a great deal of improvement with regard to his pain.  He has had to go up to 80 mg 3 times a day of OxyContin and continue with the mexiletine which he is up to 1 twice a day.  He is tolerating the medications well.  In addition, he has continued with Cafergot, Norvasc, diazepam.  PHYSICAL EXAMINATION:  VITAL SIGNS:  Blood pressure 135/67, heart rate 99, respiratory rate 20, O2 saturation 97%, pain level 7 to 8/10.  EXTREMITIES:  He has tenderness over the right knee and ankle with swelling over the right foot.  He has a cast to his left lower extremity.  DESCRIPTION OF PROCEDURE:  After informed consent was obtained, the patient was placed in the semirecumbent position and monitored.  An IV was established in his right upper extremity.  I personally administered 900 mg of lidocaine intravenously.  His pain level went down to 1/10.  POSTPROCEDURE CONDITION:  Stable.  DISCHARGE INSTRUCTIONS: 1. Resume previous diet. 2. Limitation of activities per instruction sheet as outlined by my    assistant today.  His wife is his driver. 3. Increase mexiletine to 150 mg 1 p.o. t.i.d., #100 with 2 refills;    stop OxyContin; Duragesic 100 mcg 1 every 3 days, #10; and trial of    Elavil instead of diazepam at night 20  mg.  He tells me that he has had    some irritation from the Duragesic patches in the past, and I advised him    that maybe just a prickly heat from wearing them for so long, and we will    just see how he did with this.  I am concerned that he will increase the    dose of OxyContin he is taking and that we may get into trouble, so we need    to go with a medication that he has less temptation to raise up the dose. 4. I plan to see him back in followup in two weeks to consider a repeat    infusion.  Hopefully by increasing the mexiletine, he will have more    lasting control over his pain. DD:  11/14/00 TD:  11/14/00 Job: [id number]

## 2010-10-07 NOTE — Assessment & Plan Note (Signed)
REASON FOR VISIT:  Mr. Gladson is referred for a physiatric evaluation by  Dr. Celene Kras.  He is a 45 year old male who had a fall from a ladder in  2000 with a complex right lower extremity fracture.  He had open reduction  internal fixation.  He did sustain a tibial nerve injury documented by EMG  over at [facility] and has undergone neurologic evaluation per Dr.  Marcelino Freestone.  His nerve conduction studies were done by Dr. Neale Burly,  neurologist, at the [facility].  He has had comprehensive  clinical psychologic evaluation by [facility] and  [employer].  He was diagnosed was dysthymic disorder, mood disorder due  to medical problems, nicotine dependence.  No psychological disability is  noted.  He was followed by Dr. Vear Clock for his pain management in the past,  has tried neuromuscular server stimulation in the past.  He has tried  methadone in the past without much success.  By his report he has also tried  Duragesic patch.  Per Dr. Lew Dawes he has had a Petrillium block for reflex  sympathetic dystrophy on November 04, 2001; procedure on November 25, 2001.  He has  been seen by a podiatrist as well in spring 2003 for tarsal tunnel release  which failed to improve his symptomatology which basically consists of  burning pain which is constant, hypersensitivity to touch, with stepping on  the right foot.  This is basically from the knee down to the foot.   Other pertinent past medical history is treatment for migraines at the  [facility] with a trial of ibuprofen, Cafergot, diazepam,  Prozac.  He has had CT of his brain which was negative for a mass lesion.   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  Because of his  new job he is also concerned about taking time off for medical procedures.  He states that around 90 days of  working he is able to get a little bit more  time off.   Current pain level is 8/10, going from 7-9.   SOCIAL HISTORY:  New job, married, pack-and-a-half a day smoker x18 years.   REVIEW OF SYSTEMS:  Positive for anxiety although he states his Klonopin has  been causing tiredness during the day, depression, poor sleep, agitation,  headaches - which as noted above are chronic.  No suicidal thoughts.  No  bowel or bladder dysfunction.   Blood pressure 135/58, respirations 16, pulse 89, O2 saturation 99%.   EXAMINATION:  General:  No acute distress.  Mood and affect mildly  irritable.  Otherwise appearance is dressed in his work clothes with work  boots on.   His hips have full range of motion, knee had full range of motion.  Right  ankle lacks about 10 degrees compared to the left in terms of dorsiflexion  and plantar flexion.  Toe range of motion lacks approximately 20 degrees  right side versus the left side in both flexion and extension.  He is able  to get up on his toes and on his heels but cannot walk on his toes or heels  on the right side, but he credits this with imbalance.  He does have some  erythema in the right lower extremity but warm skin and good pedal and  posterior tibial pulse on the right side.  His  deep tendon reflexes are 3  bilateral knees, 2 at the left ankle, 1 at the right ankle.  His sensation  is equal at the knee and above on the right side versus the left side.  I am  able to brush his foot without significant allodynia.   IMPRESSION:  1. Causalgia, right lower extremity, primarily tibial nerve distribution     although some elements of CRPS #1 as well.  2. The patient is narcotic dependent for pain management.  He is now off the     oxycodone but is nevertheless on Avinza 240 mg a day.  3. Anxiety, on Klonopin 1 mg.  We are just going to keep this in the evening     as it causes some sedation during the day.  4. Nicotine addiction.   PLAN:  1. The  patient will follow up with Dr. Pamelia Hoit in 1 month.  2. Continue Avinza 240 a day.  3. Klonopin 1 mg q.h.s.  4. The patient would consider other invasive procedures including a dorsal     column stimulator but would not entertain this until he is a little bit     more established at his new job.      Erick Colace, M.D.   AEK/MedQ  D:  07/10/2003 15:38:20  T:  07/10/2003 17:03:03  Job #:  [id number]

## 2010-10-07 NOTE — H&P (Signed)
[facility]  Patient:    Peter Zimmerman, Peter Zimmerman                   MRN: [id number] Adm. Date:  [id number] Attending:  Thyra Breed CC:         [name], M.D.   History and Physical  Josuel comes in for followup evaluation of his right lower extremity pain on the basis of a complex regional pain syndrome.  Since his last evaluation he has noted no improvements with the IV infusions of lidocaine.  He has not noted that the mexiletine is of much benefit.  He does not feel as though the Duragesic is really helping him to an extent.  He continues to have insomnia which is a premorbid problem and is taking up to 300 mg of doxepin which he has been on for quite some time for migraines.  He is taking one ibuprofen per day as a prophylaxis for ______ Cafergot p.r.n.  He is taking diazepam for anxiety and he feels more anxious than in the past, but is beginning to get somewhat down on himself about the fact that he is not working and he does not see an end in sight.  He has been out of work for several months now and apparently is not able to get disability.  He continues on Norvasc 5 mg one b.i.d.  The patient describes his pain as a severe pain predominantly over the anterior aspect of the right leg and over the arch of the foot which is brought on by weightbearing or pressure.  He had decreased sensation of that right lower extremity.  He is currently rating his pain at 8/10.  The patient states that he is not suicidal and is not at a point that he is interested in sitting down with a psychologist for the time being.  We addressed this issue in detail.  PHYSICAL EXAMINATION  VITAL SIGNS:  Blood pressure 140/81, heart rate 93, respiratory rate 17, O2 saturation 99%, pain level 8/10.  EXTREMITIES:  He exhibits atrophy of his right thigh musculature.  He has well healed surgical scars over his right knee.  He has allodynia over the anterior aspect of his  right upper leg, a negative Tinels sign of the peroneal nerve as it goes about the fibular head.  He is tender along the arch of his foot to light touch, but not over the dorsum of his foot.  Both feet demonstrate solar erythema.  Dorsalis pedis pulses were 2+ and symmetric.  He has attenuated pin prick in a stocking distribution from the knee down on the right lower extremity.  IMPRESSION:  Complex regional pain syndrome of the right knee status post tibial plateau fracture with surgical reconstruction with poor response to lumbar sympathetic blocks and infusions of lidocaine.  DISPOSITION: 1. Discontinue mexiletine. 2. Reduce Duragesic to 75 mcg q.3 days.  He has a prescription of this at    home. 3. Reduce doxepin to 100 mg two p.o. q.p.m. #60 with three refills. 4. Increase ibuprofen to 800 mg t.i.d. as long as his stomach tolerates it. 5. Continue his Cafergot, Norvasc, and diazepam per his other physicians. 6. Trial of a TTS I on his right foot to be applied once a week.  He was made    aware of the potential interactions with the clonidine and the doxepin and    Norvasc.  He is to check his heart rate regularly and let us know if  it is    dropping down below 60 consistently. 7. Trileptal 150 mg one p.o. q.p.m. x 7 days, then one p.o. b.i.d. x 10 days,    then one p.o. t.i.d. #100 with two refills.  The patient was advised of the    side effects of this medication. 8. Follow-up with me in three weeks.  He is to call me if he feels more    depressed between now and his next appointment or if he is having side    effects to the medications prescribed today. DD:  11/28/00 TD:  11/28/00 Job: [id number]

## 2010-10-07 NOTE — Procedures (Signed)
[facility]  Patient:    Peter Zimmerman, Peter Zimmerman                   MRN: [id number] Proc. Date: 10/22/00 Adm. Date:  [id number] Attending:  Thyra Breed                           Procedure Report  PROCEDURE:  Lumbar sympathetic block.  ANESTHESIOLOGIST:  Thyra Breed, M.D.  DIAGNOSIS:  Complex regional pain syndrome of the right lower extremity.  INTERVAL HISTORY:  The patient has noted improvements with regard to his foot. He is able to walk on it a little bit more.  He does not feel as though the Duragesic patch is very helpful and would like to at least try the OxyContin again.  In the past, he has had a lot of problems with sedation from this but he is just not getting a very good level of control with the Duragesic patch. He does feel as though the blocks are helping.  EXAMINATION:  Blood pressure 151/68.  Heart rate is 103.  Respiratory rate is 18.  O2 saturation is 97%.  He continues to have increased warmth and swelling over the right foot with limited range of motion of the ankle.  DESCRIPTION OF PROCEDURE:  After informed consent was obtained, the patient was taken to the fluoroscopy suite where he had an IV established in the right upper extremity.  Monitors were placed.  He was placed in the prone position with a pillow under his abdomen.  I identified lumbar vertebrae, L1 through L5, and marked the spinous processes.  Seven centimeters lateral to the spinous process of L2 on the right side, a mark was made on the skin.  The skin was prepped with Betadine x 3 and draped.  Using a 25-gauge short needle, I anesthetized the skin and subcutaneous structures with 1% lidocaine and with a 25-gauge spinal needle, I anesthetized deep structures with 1% lidocaine; the total amount of 1% lidocaine used were 2 cc.  A 22-gauge Johnney Ou was introduced to the anterolateral aspect of L3 by AP, lateral and oblique projections.  The patient was sedated with  Versed and Fentanyl.  I injected 25 ml of 1% chloroprocaine in 1 ml increments with negative aspirates between each increment.  Twenty ml of 0.25% bupivacaine were injected in 1 ml increments with negative aspirates.  The needle was flushed with air and removed intact.  POST-PROCEDURAL CONDITION:  Patients pain was markedly improved and vital signs were stable.  DISCHARGE INSTRUCTIONS: 1. Resume previous diet. 2. Limitation in activities per instruction sheet as outlined by my assistant    today. 3. Stop Duragesic. 4. Continue with the Percocet 7.5/500 mg for breakthrough pain. 5. OxyContin 40 mg 1 p.o. b.i.d., #60 with no refill. 6. Follow up with me next week for repeat injection. DD:  10/22/00 TD:  10/22/00 Job: [id number]

## 2010-10-07 NOTE — Discharge Summary (Signed)
[facility]  Patient:    Peter Zimmerman, Peter Zimmerman Visit Number: [id number] MRN: [id number]          Service Type: PMG Location: [facility] Attending Physician:  Thyra Breed Dictated by:   Jasmine Pang, M.D. Admit Date:  01/09/2001 Discharge Date: 01/19/2001                             Discharge Summary  INCOMPLETE  REASON FOR ADMISSION:  Patient was a 45 year old married Caucasian male who was admitted on an involuntary basis for depression and suicidal ideation.  He presents with a history of depression since his knee surgery in November 2001. 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  He has begun to feel hopeless and worthless, as well as anxious and depressed.  He had an argument with his wife on the day of admission and threatened to kill himself.  He had left with a gun and went to his mothers. The police were called and intervened.  He states he had no intention of actually harming himself and was afraid his wife would pawn it if he left it at home.  He denied any homicidal ideation.  There was no psychosis or perceptual disturbance.  Sleep has been decreased, appetite has been within normal limits.  He states he has been on every possible antidepressant and is hesitant to start another.  PAST PSYCHIATRIC HISTORY:  This is the first admission to [facility].  He has had no outpatient treatment.  He has apparently been treated by his primary care physician with the antidepressants.  SUBSTANCE ABUSE HISTORY:  Patient is a nondrinker.  He denies substance abuse. He smokes 1 pack a day for 15 years.  PAST MEDICAL HISTORY:  Patients doctor is Dr. Jonny Ruiz at [facility]. Medical problems include hypertension, headaches, status post knee fracture with an injury on March 27, 2000.  ALLERGIES:  No known drug allergies.  MEDICATIONS: 1. Doxepin 100 mg q.h.s. 2. Methadone 10 mg q.6h.  p.r.n. knee pain. 3. Norvasc 5 mg p.o. b.i.d. 4. Diazepam 5 mg p.o. b.i.d. and at h.s. 5. Cafergot 50 mg p.o. q.6h. p.r.n. migraines. 6. Ibuprofen 800 mg p.o. t.i.d. with meals.  PHYSICAL EXAMINATION:  This was performed by our family nurse practitioner Landry Corporal, N.P.  There were no acute medical problems noted.  ADMISSION LABORATORIES:  CBC was grossly within normal limits except for a slightly elevated WBC count at 11.3 (4 - 10.5).  The routine chemistry profile was grossly within normal limits except for a slightly elevated CO2 of 34 (20-32).  BUN was less than 5 (6-23).  ALT was 42 (0-40).  Hypothyroid profile was grossly within normal limits except for a slightly elevated TSH at 7.209 (.35-5.5).  T3 uptake and free T4 were within normal limits.  Urine drug screen was positive for methadone, which he states he takes for pain, and benzodiazepines.  Urinalysis was within normal limits.  HOSPITAL COURSE:  Upon admission was continued on his current medications as listed under the medications section above.  The dosages of his pain medications were verified with Dr. Thyra Breed at the [facility] clinic where he is seen.  Dr. Vear Clock indicated the patient was on methadone 10 mg every 6 hours.  On December 28, 2000, patient began Celexa 20 mg q.d. Doxepin was discontinued.  He was also begun on Seroquel 25 mg  p.o. q.h.s.  He tolerated these medications well with no significant side effects.  He seemed willing to continue the antidepressant Celexa.  He was able to participate appropriately in unit therapeutic groups and activities.  As the hospitalization progressed, his mental status improved.  Mood became less depressed.  There was a resolution of suicidal ideation.  There was no homicidal ideation or self injurious behavior.  There was no aggression, no psychosis or perceptual disturbance.  Thought processes were logical and goal directed.  Cognitive was within normal  limits.  Insight fair, judgment fair. At the time of discharge he was felt able to be managed safely in a less restrictive setting.  DISCHARGE DIAGNOSES: Axis I:    Major depressive disorder, recurrent, severe, without psychosis. Axis II:   None. Axis III:  Hypertension, migraine headaches, knee pain. Axis IV:   Severe (problems related to primary support group, occupation,            finances and other psychosocial problems related to disability). Axis V:    Global assessment of function current 50, on admission 30,            estimated this past year global assessment of function 70. Dictated by:   Jasmine Pang, M.D. Attending Physician:  Thyra Breed DD:  01/31/01 TD:  01/31/01 Job: [id number]

## 2010-10-07 NOTE — H&P (Signed)
[facility]  Patient:    Peter Zimmerman, Peter Zimmerman                   MRN: [id number] Adm. Date:  [id number] Attending:  Thyra Breed CC:         [name], M.D.  Dr. Caffie Damme[facility]   History and Physical  FOLLOWUP EVALUATION  HISTORY OF PRESENT ILLNESS:  [name] November comes in for follow-up evaluation of his right lower extremity pain.  Since his last visit, he has had nerve conduction studies which the patient reports as being abnormal, showing neuropathy of the tibial nerve.  He saw Dr. Sherlean Foot yesterday.  He presents today noting that he continues to have severe pain despite his Duragesic at 100 mcg q.3 days with the mexiletine, which is causing some burning dyspepsia, and the Doxepin 100 mg 2 tablets at night, Ibuprofen, Cafergot, Norvasc, and Diazepam.  The patient continues to note pain with weightbearing.  He noted that he developed a blister on the sole of his right foot after walking on some hot concrete, as he does not have any sensation over that aspect of his foot.  He continues to use a crutch to get about.  PHYSICAL EXAMINATION:  VITAL SIGNS:  Blood pressure 144/91, heart rate 105, respiratory rate 18, and O2 saturation 98%.  Pain level 10.  NEUROLOGIC:  He exhibits attenuated perception of pinprick over right lower extremity from the knee down.  He has edema of the foot.  He exhibits atrophy of the right lower extremity in the thigh region.  He has allodynia over dorsum of the foot.  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  DISPOSITION: 1. Stop mexiletine since it is upsetting his stomach. 2. Reduce Duragesic to 50 mcg q.3 days x 3, then start methadone 10 mg 1 p.o.    q.6h., #120 with no refill. 3. Continue on Doxepin. 4. Stop trileptal, as it is making him too dizzy, and he already has stopped    this. 5. Stop the clonidine as it is not helping him and he is not wearing it    anyway. 6.  Consider trial of lamictal at his next visit versus trial of Dilantin. DD:  12/19/00 TD:  12/19/00 Job: [id number]

## 2010-10-07 NOTE — Assessment & Plan Note (Signed)
CHIEF COMPLAINT:  Right leg pain.   HISTORY OF PRESENT ILLNESS:  Peter Zimmerman is a 45 year old gentleman who  fell from a ladder in 2002. Was seen by Dr. Wynn Banker on July 10, 2003.  He has a history of an open reduction internal fixation of a very complex  tibial fracture and also an accompanying tibial nerve injury. He has a  history of a dysthymic mood disorder, nicotine dependence and history of  migraines. He has been followed by Dr. Stevphen Rochester, who is also going to have him  see Dr. Daleen Bo for considerations of neuro stimulation if they move in  that direction. He will also followup with Dr. Stevphen Rochester, possibly for  sympathetic block. He is currently at a new job and would like to wait  another month and a half, prior to looking into these therapeutic options.  Peter Zimmerman has been taking Avinza for pain relief. He has been taking 120  two tablets p.o. q.d. He was given some Klonopin as well at the last visit.  He feels that this has made him too drowsy and he has stopped taking it  during the day and takes it in the evening when he wishes to retire. He  denies any problems with chronic constipation. He occasionally has some  bouts but it is not a major problem for him. He is also taking Ibuprofen and  Cafergot for his migraine headaches. Overall, his mood has been good. He  does get depressed but he denies that he could harm himself or others.   SOCIAL HISTORY:  He has been married for 20 years. He smokes a pack of  cigarettes a day. Denies alcohol use. Lives with his wife and his 16-year-  old. He is currently working at a company [employer]. He is in the  [employer] business.   FAMILY HISTORY:  His mother is 19. Has diabetes, cancer, and chronic  obstructive pulmonary disease. He has a father who is 61 and is healthy.   ALLERGIES:  Denies any drug allergies.   CURRENT MEDICATIONS:  Include Ibuprofen 800 2 p.o. q. 4 to 6 hours p.r.n.,  Fluoxetine 20 mg 1 p.o. q.d.,  Cafergot 60 mg 1 p.o. q. 4 to 6 hour p.r.n.,  Avinza 120 mg 2 p.o. q.d., Lisinopril 20 mg 1 p.o. q.d., Klonopin 1 mg 1  p.o. b.i.d. but he is currently taking it q.d.   PHYSICAL EXAMINATION:  VITAL SIGNS:  Blood pressure 131/73, pulse 83,  respiratory rate 16, 99% saturated on room air.  GENERAL:  He is cooperative and appropriate. A well developed, well  nourished gentleman. No apparent distress during our interview.  NEUROLOGIC:  He gets off the examination table. His gait displays a short  strike length. Decreased heel strike and toe off on the right and more of a  foot flat type of gait. His tibia is rotated so that his foot stays in  somewhat more external rotation than the left lower extremity.  HEART:  Regular rhythm.  LUNGS:  Clear.  ABDOMEN:  Benign.  EXTREMITIES: He has decreased sensation throughout the right tibial nerve  distribution. His motor strength is 5 over 5 in the lower extremities. He  has diminished dorsiflexion on the right. He is able to get to approximately  90 degrees and not much more than that. His right foot is discolored and  somewhat cyanotic appearing. His left foot is also somewhat cyanotic but not  as involved as the right foot. His right  foot is cooler, compared to the  left foot as well. His dorsalis pedis pulse is present in both lower  extremities. I have difficulty appreciating the posterior tibial pulse  bilaterally. There is no edema noted in the lower extremity. He does have  some scarring, which is well healed. Tone is otherwise normal. He has quite  a bit of tenderness, especially in the mid foot area.   IMPRESSION:  1. Causalgia right lower extremity, primarily tibial nerve distribution.  2. History of migraines.  3. Nicotine dependency.  4. Some sleep disturbance.   PLAN:  Will refill his Avinza today 120 mg 2 p.o. daily, #60. Will give him  some Nortriptyline 10 mg 1 p.o. q.h.s. p.r.n., #30, and Prilosec 20 mg 1  p.o. daily, #30. Will  give him the prescription for Biotex to have them  assess him for a rocker bottom shoe on the right to help with his gait  disturbance and will see him back in 1 month.      Brantley Stage, M.D.   DMK/MedQ  D:  08/07/2003 18:24:50  T:  08/08/2003 13:28:53  Job #:  [id number]   cc:   Erick Colace, M.D.  [address] [place]  [place] [address]  Fax: [phone]

## 2010-10-07 NOTE — Op Note (Signed)
[place]  Patient:    Peter Zimmerman, Peter Zimmerman                   MRN: [id number] Proc. Date: 03/27/00 Adm. Date:  [id number] Attending:  [name]                           Operative Report  PREOPERATIVE DIAGNOSIS:  Right tibial plateau fracture.  POSTOPERATIVE DIAGNOSIS:  Right tibial plateau fracture.  PROCEDURE:  Right open reduction and internal fixation, tibial plateau fracture right.  SURGEON:  [name], M.D.  ASSISTANT:  Jerolyn Shin. Tresa Res, M.D.  ANESTHESIA:  General endotracheal.  INDICATION FOR PROCEDURE:  The patient is a 45 year old white male who is approximately 18 hours status post fall off a ladder with radiographic diagnosis of Schatzker type 5 tibial plateau fracture.  Informed consent was obtained.  DESCRIPTION OF PROCEDURE:  Patient taken to the operating room and laid in supine position and administered general endotracheal anesthesia, and the right lower extremity was prepped and draped in the usual sterile fashion.  A standard hockey stick incision was used, making the incision with a #10 blade approximately 15-14 cm in length.  Subperiosteal dissection was continued proximally around the lateral tibial plateau, and the subperiosteal dissection was continued down approximately 6 cm below the tibial tubercle along the lateral border of the shaft.  We then cleaned up the fracture site, irrigated the fracture site, and took C-arm images to realize that the major fragment of the joint surface was depressed, both posteriorly and inferiorly.  Through the fracture site, we manipulated the fracture fragment so that it was up close to anatomic.  I elected to elevate the lateral meniscus by placing 0 Vicryl stay sutures in the coronary ligament and then palpated the intra-articular fragment to make sure that it was in as near-anatomic position as possible.  Once that was done, the fragments were reduced.  K-wires were used  for temporary fixation, and an eight-hole low-profile periarticular plate from [employer] was fashioned to the lateral cortex.  We then filled one distal hole in the shaft to buttress the piece and gain anatomic length.  The fracture lines were reduced anatomically outside of the joint.  After C-arm imaging, it was difficulty to tell, since there were at least four intra-articular pieces.  With direct palpation and visualization, things appeared to be well-aligned, taking the knee through a range of motion from 0-65 degrees.  We therefore placed our two 85 mm fully cancellous 6.5 screws to the plate and then our distal shaft screws to the plate as well.  We irrigated, took AP and lateral images, which we felt were adequate, and then closed with interrupted 0 Vicryl, closing the soft tissue over the plate, interrupted 0 Vicryl bringing together the deep soft tissues, interrupted 2-0 Vicryl bringing together the subcuticular layer, and skin staples.  A medium Hemovac was left deep to the fascial closure.  We then used skin staples and dressed the wound with Adaptic, 4 x 4s, sterile Webril, an Ace wrap, and then a knee immobilizer.  TOURNIQUETS:  None.  DRAINS:  _____.  COMPLICATIONS:  None. DD:  03/28/00 TD:  03/29/00 Job: [id number]

## 2010-10-07 NOTE — H&P (Signed)
[facility]  Patient:    Peter Zimmerman, Peter Zimmerman Visit Number: [id number] MRN: [id number]          Service Type: PMG Location: [facility] Attending Physician:  Thyra Breed Adm. Date:  10/04/2000 Disc. Date: 01/02/2001   CC:         Peter Zimmerman, M.D. [facility]  Peter Zimmerman, M.D.  _______________________   History and Physical  FOLLOWUP EVALUATION:  Peter Zimmerman comes in for followup evaluation of his right lower extremity pain with underlying tibial neuropathy and complex regional pain syndrome.  Since his last evaluation, the patient has been hospitalized with depression and seen by Dr. Katrinka Zimmerman while hospitalized and treated.  He continues to have pain in his right lower extremity but realizes that he has to take his medications as prescribed.  He has seen Dr. [name] since his last visit, who has recommended that he see Dr. [name].  He has seen Dr. [name] in the past who did his nerve conduction studies.  CURRENT MEDICATIONS: 1. Carafate for headaches. 2. Ibuprofen. 3. Methadone 10 mg q.6h. 4. Norvasc b.i.d. 5. Diazepam 5 mg up to every six hours p.r.n. 6. Doxepin 100 mg two tablets in the evening. 7. He has been given prescriptions for Seroquel 25 mg one at night and Celexa    20 mg per day.  EXAMINATION:  VITAL SIGNS:  Blood pressure 137/86.  Heart rate is 102.  Respiratory rate is 18.  O2 saturation is 96%.  Pain level is 8/10.  EXTREMITIES:  The patient has increased warmth emanating from the lower aspect of his right lower extremity with allodynia.  He has decreased pinprick in the same distribution as previously.  Additional physical exam findings are atrophy of his right thigh relative to his left.  NEUROLOGIC:  Straight leg raise signs are negative.  His sensory exam is unchanged from previously.  ADDITIONAL HISTORY:  The patient has developed some right hip and groin pain but attributes this to using his  crutches.  He has been turned down for disability x 2 despite his current limitations.  IMPRESSION: 1. Complex regional pain syndrome of the right lower extremity with tibial    neuropathy. 2. Left hip discomfort which I suspect is coming from changes in his gait    relative to his back pain syndrome. 3. Depression which is likely reactive to his current situation.  DISPOSITION: 1. Continue on methadone 10 mg 1 p.o. q.6h., #120 with no refill. 2. Reduce Doxepin to 100 mg at night. 3. Celexa 20 mg 1 p.o. q.d., #30 with 2 refills. 4. Diazepam 5 mg 1 p.o. q.6h. p.r.n., #100 with 1 refill. 5. Continue ibuprofen, Cafergot, and Norvasc as previously. 6. I will hold on having him continue on the Seroquel. 7. The patient was encouraged to go ahead and have a neurologist take a look    at things but I recommended that he see if Dr. [name] or one of his    associates could see him, since they have done a nerve conduction study    there. 8. Follow up with me in four weeks.  I advised the patient that I was    surprised that he had been turned down for disability since he requires    crutches to ambulate and his pain level is continuing to be elevated at    8/10 minimums since he has been seen by [place].  He does have a well-documented    tibial neuropathy  and clinically demonstrated complex regional pain    syndrome of the right lower extremity. Attending Physician:  Thyra Breed DD:  01/09/01 TD:  01/10/01 Job: [id number]

## 2010-10-07 NOTE — Discharge Summary (Signed)
[facility]  Patient:    Peter Zimmerman, Peter Zimmerman                   MRN: [id number] Adm. Date:  [id number] Disc. Date: 12/29/00 Attending:  Denny Peon CC:         [facility].   Discharge Summary  REASON FOR ADMISSION:  Patient was a 45 year old married Caucasian male.  He was admitted on an involuntary basis due to depressive symptoms and suicidal ideation.  The patient presents with a history of depression since his knee surgery in November 2001.  He states he feels very overwhelmed with his situation.  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  He had an argument with his wife on the day of admission and threatened to kill himself.  He had left with a gun and the police were called.  He was subsequently committed.  He later denied he had any suicidal or homicidal intentions.  He states he took his job because he thought his wife would "pawn it."  Patient is currently on methadone regimen for pain managementdue to his knee surgery.  He denies abusing it, although he does state he has taken extra methadone in between routine doses at times when in severe pain.  PAST PSYCHIATRIC HISTORY:  This is patients first admission to [facility].  He has had no previous psychiatric treatment.  SUBSTANCE ABUSE HISTORY:  Patient has smoked 1 pack per day for 15 years.  He is a nondrinker.  He denies substance abuse.  PAST MEDICAL HISTORY:  Primary care Vinny Taranto is Dr. Jonny Ruiz at [facility].  Medical problems include hypertension, headaches, and status post knee fracture secondary to an injury on March 27, 2000.  Medications:  doxepin 100 mg p.o. q.h.s., methadone 10 mg p.o. q.6h. p.r.n. knee pain, Norvasc 5mg  p.o. b.i.d., Diazepam 5 mg p.o. b.i.d. and at h.s., Cafergot 50 mg q.6h. p.r.n. migraine headaches, ibuprofen 800 mg p.o. t.i.d. with meals.  ALLERGIES:  No known drug  allergies.  PHYSICAL EXAMINATION:  Patient was in a wheelchair in no acute distress.  He was noted to be somewhat unkempt and unshaven.  There were no acute medical problems.  ADMISSION LABORATORIES:  CBC was positive for slightly elevated WBC count of 11.3 (4-10.5).  The routine chemistry profile was grossly within normallimits except for a slightly elevated CO2 of 34 (20-32), BUN was less than 5 (6-23), ALT was 42 (0-40).  The AST was 100 (0-37).  Hypothyroid panel was grossly within normal limits except for a slightly elevated TSH of7.209 (.350 to 5.5).  Urinalysis was grossly within normal limits.  Urine drug screen was positive for methadone and benzodiazepines.  HOSPITAL COURSE:  Upon admission, patient was continued on his home medications, including Doxepin 100 mg q.h.s., methadone 10 mg p.o. 6.qh. p.r.n. knee pain, Norvasc 5 mg p.o. b.i.d., Diazepam 5 mg p.o. b.i.d. and h.s., Cafergot 50 mg p.o. q.6h. p.r.n. migraine headaches, ibuprofen 800 mg p.o. t.i.d. with meals or antacid p.r.n. pain.  On December 28, 2000, he was begun on Celexa 20 mg p.o. q.d.  Doxepin was discontinued.  Seroquel 25 mg p.o. q.h.s. was begun.  During early morning of December 29, 2000, Seroquel 50 p.o. was given, with a repeat dose if still awake within 30 minutes.  Patient tolerated these medications well.  He had no significant side effects.  He was able to participate appropriately in unit therapeutic  groups and activities. He had a family session with his wife and was able to develop coping strategies for dealing with his depression.  His mood was still somewhat irritable and anxious but overall improved for admission status.  No suicidal or homicidal ideation, no psychosis or perceptual disturbance.  Cognitive back to baseline, insight fair, judgment fair.  At the time of discharge of discharge he was able to transitioned safely to a less restrictive setting.  DISCHARGE DIAGNOSES: Axis I:    Major  depression, recurrent, severe, without psychosis. Axis II:   None. Axis III:  Hypertension, migraine headaches, knee pain. Axis IV:   Severe,with problems related to primary support group,            occupation, finances and other psychosocial problems related            to disability. Axis V:    Global assessment of function current 50, on admission 30,           highest past year global assessment of function 70.  DISCHARGE MEDICATIONS: 1. Diazepam 5 mg b.i.d. and at h.s. 2. Norvasc 5 mg a.m. and 6 p.m. 3. Celexa 20 mg q.a.m. 4. Seroquel 25 mg p.o. q.h.s. 5. Methadone HDL 10 mg q.6h. p.r.n. pain 6. Cafergot 50 mg q.6h. p.r.n. migraines. 7. Ibuprofen 800 mg p.o. t.i.d. as needed with meals, for pain.  ACTIVITY LEVEL:  No restrictions.  DIET:  No restrictions.  POST HOSPITAL CARE PLAN:  Patient will return home to live withhis wife. Follow up therapy and medication management will be at the [facility].  He will be seen by Dr. Cheree Ditto on August 14,2002, at 10 a.m. He will also be seen by Enzo Montgomery for therapy. DD:  12/30/00 TD:  12/30/00 Job: [id number]

## 2010-10-07 NOTE — Discharge Summary (Signed)
[place]  Patient:    Peter Zimmerman, Peter Zimmerman                   MRN: [id number] Adm. Date:  [id number] Disc. Date: [id number] Attending:  [name] Dictator:   Arnoldo Morale, P.A.C.                           Discharge Summary  ADMISSION DIAGNOSES: 1. Right tibial plateau fracture. 2. Hypertension. 3. Anxiety disorder. 4. History of migraines.  DISCHARGE DIAGNOSES: 1. Right tibial plateau fracture. 2. Hypertension. 3. Anxiety disorder. 4. History of migraines. 5. Right ankle sprain.  SURGICAL PROCEDURE:  On March 28, 2000, Peter Zimmerman underwent an open reduction internal fixation of his right tibial plateau fracture by Dr. Mila Homer. [place].  COMPLICATIONS:  None.  CONSULTATIONS: 1. Physical therapy consult on March 29, 2000. 2. Case management consult on March 30, 2000.  HISTORY OF PRESENT ILLNESS:  This 45 year old white male was climbing a ladder with a load in his arms when he fell approximately 6 to 8 feet off the lander onto his right knee.  He complained of immediate pain in the right knee, and was unable to stand.  He had no other complaints.  He was brought to [facility] Emergency Department where he was found to have a right tibial plateau fracture.  He is admitted for surgical fixation of this fracture.  HOSPITAL COURSE:  Peter Zimmerman was admitted on March 27, 2000, and was placed in a knee immobilizer with ice pack and pain medication.  Leg was elevated.  He subsequently went to the operating room on March 28, 2000, and tolerated surgery without immediate complications.  On postoperative day #1, he was afebrile, vital signs stable.  Leg was neurovascularly intact.  He was to start on physical therapy, but had some problems with dizziness and sedation due to his medication.  This was adjusted.  On postoperative day #2, his temperature max was 102.9, vital signs stable. He still was drowsy.  Right knee incision was  well approximated with staples and minimal drainage.  He did have pain with passive and active range of motion of his right foot and that did seem equal.  We were worried about compartment syndrome, and compartment pressures were measured in the anterior and anterolateral compartments that day, and they were 28 and 32 mmHg, respectively.  Strict elevation was done to the knee with ice, and this was monitored.  He was started on OxyContin for pain control.  On postoperative day #3, his pain was much improved.  He did complain of some right ankle soreness at that time.  It was felt he had a sprained ankle, and was placed in a pull over ankle support.  His temperature max was 100.6, and his right knee incisions were unchanged.  Pain in his foot had decreased, and his pain medication was working well.  He was believed ready for discharge home, and was discharged home later that day.  DISCHARGE INSTRUCTIONS: 1. He is to resume all pre-hospital medications and diet. 2. Percocet one or two tablets p.o. q.4h. p.r.n. pain, 45 with no refill. 3. Non-weightbearing on the right leg with the use of the immobilizer,    crutches, and walker.  He needs to keep the right leg elevated above the    heart as much as possible. 4. Ice pack to the right knee. 5. He is to follow up with  Dr. Sherlean Foot in about 7 to 10 days, and is to call    [phone] to set up that appointment. 6. He should notify Dr. Sherlean Foot of temperatures greater than 101.5, chills, pain    unrelieved by pain medications, or foul smelling drainage from the wound or    decreased sensation to the foot.  He was also asked to take one    enteric-coated aspirin a day.  He stated good understanding of these instructions, and was subsequently discharged to home.  LABORATORY DATA:  Chest x-ray done on March 27, 2000, showed no evidence for acute cardiopulmonary disease.  C-arm was used intraoperatively.  On March 27, 2000, his white count was  19.8.  On March 29, 2000, his white count was 16.1, hemoglobin 11.3, hematocrit 31.6.  On March 27, 2000, he did have 1 mL/dL of urobilinogen in his urine, and his glucose was 117.  All other laboratory studies were within normal limits. DD:  04/19/00 TD:  04/19/00 Job: [id number]

## 2010-10-07 NOTE — H&P (Signed)
[facility]  Patient:    Peter Zimmerman, Peter Zimmerman                   MRN: [id number] Adm. Date:  [id number] Attending:  Denny Peon Dictator:   Candi Leash. Theressa Stamps, N.P.                   Psychiatric Admission Assessment  PATIENT IDENTIFICATION:  This is a 45 year old married white male involuntarily committed for depression and suicidal ideation.  HISTORY OF PRESENT ILLNESS:  The patient presents with a history of depression since his knee surgery in November 2001.  The patient feels very overwhelmed with his situation.  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 The patient had an argument with his wife yesterday and threatened to kill himself.  He had left with the gun because he thought she would "pawn it" and went to his mothers.  The police were called.  The patient reports that he had no intention, it was just something to say, that he did not mean it.  He denies any suicidal or homicidal ideations, no auditory or visual hallucinations, reports decreased sleep.  Appetite has been satisfactory.  The patient reports that he does not want to be started on any antidepressant, that he has "tried everything."  The patient does state that he may have taken an extra methadone in between routine doses but states he does not abuse them.  PAST PSYCHIATRIC HISTORY:  This is his first admission to [facility], no outpatient treatment.  SUBSTANCE ABUSE HISTORY:  Smokes one pack a day for 15 years.  He is a nondrinker.  He denies any substance abuse.  PAST MEDICAL HISTORY:  Primary care Christain Mcraney is Dr. Jonny Ruiz at [facility].  Medical problems are hypertension, headaches, and status post knee fracture with an injury on March 27, 2000.  MEDICATIONS: 1. Doxepin 100 mg one p.o. q.h.s. 2. Methadone 10 mg p.o. q.6h. p.r.n. for knee pain. 3. Norvasc 5 mg p.o. b.i.d. 4. Diazepam 5 mg b.i.d. at h.s. 5. Cafergot 50 mg p.o. q.6h.  p.r.n. for migraines. 6. Ibuprofen 800 mg p.o. t.i.d. with meals.  DRUG ALLERGIES:  No known drug allergies.  REVIEW OF SYSTEMS:  The patient reports his health is good except for his knee injury.  No problems with his vision, no sinus problems, no chest pain or chest pressure, no cough or shortness of breath, no problems with GI, no heartburn, constipation, or diarrhea.  GU: No dysuria.  MUSCULOSKELETAL: Knee fracture in November 2001 with the patient requiring assistance per crutches, difficulty walking.  SKIN: No eczema, open wounds, or rashes.  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  PSYCHIATRIC: Some recent depression and insomnia.  ENDOCRINE: No thyroid or diabetic problems, no enlarged or tender nodes, no anemias. ALLERGIES: No environmental allergies.  PHYSICAL EXAMINATION:  VITAL SIGNS:  The patient is 6 feet 1 inch tall, 187 pounds.  Last vital signs: Temperature 97.6, blood pressure 149/97, heart rate 109, respiratory rate 20.  GENERAL:  The patient is a 45 year old Caucasian male in a wheelchair in no acute distress.  He appears well-developed, somewhat unkempt.  He is unshaven and hair is not combed.  HEENT:  Head is normocephalic.  He can raise his eyebrows.  His hair is of equal distribution.  EOMs are intact.  External ear canals are patent.  No sinus tenderness, no nasal discharge.  NECK:  No JVD, negative lymphadenopathy.  Trachea is midline.  Thyroid is nonpalpable and nontender.  CHEST:  Clear to auscultation, no adventitious sounds, no cough.  HEART:  Regular rate and rhythm without murmurs, gallops, or rubs.  No edema was noted.  ABDOMEN:  Soft, nontender abdomen, no CVA tenderness.  MUSCULOSKELETAL:  No joint swelling or deformity.  The patient has a brace to his right knee.  Muscle strength and tone is equal bilaterally.  SKIN:  Warm and dry with good turgor.  Nail beds are pink with good  capillary refill, strong bilateral radial pulses.  NEUROLOGIC:  Oriented x 3.  Cranial nerves are grossly intact.  Good grip strength bilaterally.  No involuntary movements.  Did not assess gait due to injury.  Cerebellar function is intact.  Health maintenance issues were addressed.  SOCIAL HISTORY:  A 45 year old married white male, been married for 16 years. Has 47 year old and 9 year old children.  He lives with his wife and son.  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  He has completed the 10th grade, then completed his GED.  He has no legal problems.  FAMILY HISTORY:  Mother with depression.  Alcohol on his fathers side.  MENTAL STATUS EXAMINATION:  He is an alert, young middle-aged Caucasian male in a wheelchair.  He is cooperative, unkempt, fair eye contact.  Speech is normal and relevant.  Mood is depressed.  Affect is flat.  Thought processes are coherent.  There is no evidence of psychosis, no suicidal or homicidal ideations, no auditory or visual hallucinations, no paranoia.  Cognitive functioning is intact.  Memory is good.  Judgment is fair.  Insight is limited.  ADMISSION DIAGNOSES: Axis I:    Major depressive disorder. Axis II:   Deferred. Axis III:  1. Hypertension.            2. Migraine headaches.            3. Knee pain. Axis IV:   Severe with problems related to primary support group, occupation,            finances, and other psychosocial problems related to disability. Axis V:    Current is 30, estimated this past year is 70.  INITIAL PLAN OF CARE:  Plan is involuntary commitment for depression and suicidal ideation.  Contract for safety.  Check every 15 minutes.  Will resume his routine medications.  Will clarify his medications and dosages at pain clinic.  Will consider a marital session.  Our goal is to decrease depressive symptoms so the patient can be safe and to return the patient to prior living arrangement.  ESTIMATED LENGTH OF STAY:  Three to five  days. DD:  12/27/00 TD:  12/27/00 Job: [id number]

## 2010-10-07 NOTE — Procedures (Signed)
[facility]  Patient:    Peter Zimmerman, Peter Zimmerman                   MRN: [id number] Proc. Date: 10/17/00 Adm. Date:  [id number] Attending:  Thyra Breed                           Procedure Report  PROCEDURE:  Lumbar sympathetic block.  DIAGNOSIS:  Complex regional pain syndrome of the right lower extremity.  INTERVAL HISTORY:  The patient has noted mild improvement after the first visit on Duragesic patch and Percocet.  He continued to use a fair amount of Percocet and does rate his pain at 7/10.  He continues to have most of his pain along the plantar surface of the left foot which he describes as a burning dysesthesia with increased warmth of the foot and bluish discoloration.  PHYSICAL EXAMINATION:  Blood pressure 156/61, heart rate 96, respiratory rate 18, O2 saturations 98%.  Pain level is 7/10.  His foot is warm, swollen, and exhibits tenderness to touch.  DESCRIPTION OF PROCEDURE:  After informed consent was obtained, the patient had an IV established in his right upper extremity.  Monitors were placed, and he was placed in the prone position with pillow under his abdomen. I prepped out his back and initially anesthetized the left side of his back before realizing in the process of positioning, it was the right side we needed to work on, so his right side was reprepped out and draped.  He was prepped with Betadine x 3 on both sides before draping.  I anesthetized the right side with 2 cc of 1% lidocaine using a 25 gauge short needle and a 25 gauge long needle. A 22 gauge Chiba needle was introduced to the anterolateral aspect of L3 by AP, lateral, and oblique projections.  Aspiration was negative for blood.  I injected 20 mL of 1% chloroprocaine in mL increments with negative aspirates. The needle was next injected with 0.25% bupivacaine.  After the fifth mL, blood was noted in the needle.  The needle was flushed with 1% lidocaine with 2 cc and  repositioned so that there was no blood returned.  Aspiration was negative for blood and CSF.  I injected an additional 10 mL of 0.25% bupivacaine.  The needle was flushed and removed intact.  He was placed in the upright position and did notice that his pain level sharply decreased in the right foot to weightbearing.  Postprocedure condition - stable.  DISCHARGE INSTRUCTIONS: 1. Resume previous diet. 2. Limitations on activities per instruction sheet. 3. Increase Duragesic to 75 mcg every 3 days #10 with no refill. 4. Percocet 7.5/500, 1 p.o. q.6h. p.r.n. #100 with no refill to be taken for    breakthrough pain. 5. Follow up with me next week to consider repeat injection of lumbar    sympathetic block versus IV infusion of lidocaine. DD:  10/17/00 TD:  10/17/00 Job: [id number]

## 2011-03-15 ENCOUNTER — Encounter: Payer: Self-pay | Admitting: Internal Medicine

## 2011-03-15 ENCOUNTER — Ambulatory Visit (INDEPENDENT_AMBULATORY_CARE_PROVIDER_SITE_OTHER): Payer: Commercial Managed Care - PPO | Admitting: Internal Medicine

## 2011-03-15 VITALS — BP 124/70 | HR 92 | Temp 98.6°F | Wt 203.0 lb

## 2011-03-15 DIAGNOSIS — R51 Headache: Secondary | ICD-10-CM

## 2011-03-15 DIAGNOSIS — R519 Headache, unspecified: Secondary | ICD-10-CM | POA: Insufficient documentation

## 2011-03-15 DIAGNOSIS — G43909 Migraine, unspecified, not intractable, without status migrainosus: Secondary | ICD-10-CM | POA: Insufficient documentation

## 2011-03-15 MED ORDER — ELETRIPTAN HYDROBROMIDE 40 MG PO TABS: 40.0000 mg | ORAL_TABLET | ORAL | Status: DC | PRN

## 2011-03-15 NOTE — Assessment & Plan Note (Signed)
The HA he has today is similar but more severe than prior HA's so I will scan his brain to look for bleeding, masses, aneurysms, etc

## 2011-03-15 NOTE — Patient Instructions (Signed)

## 2011-03-15 NOTE — Assessment & Plan Note (Signed)
Will try relpax, he tells me that he has been having more frequent headaches and that he wants to see one of the neurologists at [facility]

## 2011-03-15 NOTE — Progress Notes (Signed)
Subjective:    Patient ID: Peter Zimmerman, male    DOB: Dec 20, 1965, 45 y.o.   MRN: [id number]  Migraine  This is a recurrent problem. The current episode started yesterday. The problem occurs constantly. The problem has been gradually worsening. The pain is located in the left unilateral region. The pain does not radiate. The pain quality is similar to prior headaches. The quality of the pain is described as pulsating and throbbing. The pain is at a severity of 3/10. The pain is moderate. Associated symptoms include phonophobia and photophobia. Pertinent negatives include no abdominal pain, abnormal behavior, anorexia, back pain, blurred vision, coughing, dizziness, drainage, ear pain, eye pain, eye redness, eye watering, facial sweating, fever, hearing loss, insomnia, loss of balance, muscle aches, nausea, neck pain, numbness, rhinorrhea, scalp tenderness, seizures, sinus pressure, sore throat, swollen glands, tingling, visual change, vomiting, weakness or weight loss. The symptoms are aggravated by nothing. He has tried NSAIDs, acetaminophen and oral narcotics for the symptoms. The treatment provided no relief. His past medical history is significant for migraine headaches.      Review of Systems  Constitutional: Negative for fever, chills, weight loss, diaphoresis, activity change, appetite change, fatigue and unexpected weight change.  HENT: Negative.  Negative for hearing loss, ear pain, sore throat, rhinorrhea, neck pain and sinus pressure.   Eyes: Positive for photophobia. Negative for blurred vision, pain, discharge, redness, itching and visual disturbance.  Respiratory: Negative for apnea, cough, choking, chest tightness, shortness of breath, wheezing and stridor.   Cardiovascular: Positive for leg swelling. Negative for chest pain and palpitations.  Gastrointestinal: Negative for nausea, vomiting, abdominal pain, diarrhea, constipation, anal bleeding and anorexia.  Genitourinary:  Negative.   Musculoskeletal: Negative for myalgias, back pain, joint swelling, arthralgias and gait problem.  Skin: Negative for color change, pallor, rash and wound.  Neurological: Negative for dizziness, tingling, tremors, seizures, syncope, facial asymmetry, speech difficulty, weakness, light-headedness, numbness, headaches and loss of balance.  Hematological: Negative for adenopathy. Does not bruise/bleed easily.  Psychiatric/Behavioral: Negative for suicidal ideas, hallucinations, behavioral problems, confusion, sleep disturbance, self-injury, dysphoric mood, decreased concentration and agitation. The patient is not nervous/anxious, does not have insomnia and is not hyperactive.        Objective:   Physical Exam  Vitals reviewed. Constitutional: He is oriented to person, place, and time. He appears well-developed and well-nourished. No distress.  HENT:  Head: Normocephalic and atraumatic.  Right Ear: External ear normal.  Left Ear: External ear normal.  Nose: Nose normal.  Mouth/Throat: Oropharynx is clear and moist. No oropharyngeal exudate.  Eyes: Conjunctivae and EOM are normal. Pupils are equal, round, and reactive to light. Right eye exhibits no discharge. Left eye exhibits no discharge. No scleral icterus.  Neck: Normal range of motion. Neck supple. No JVD present. No tracheal deviation present. No thyromegaly present.  Cardiovascular: Normal rate, regular rhythm, normal heart sounds and intact distal pulses.  Exam reveals no gallop and no friction rub.   No murmur heard. Pulmonary/Chest: Effort normal and breath sounds normal. No stridor. No respiratory distress. He has no wheezes. He has no rales. He exhibits no tenderness.  Abdominal: Soft. Bowel sounds are normal. He exhibits no distension and no mass. There is no tenderness. There is no rebound and no guarding.  Musculoskeletal: Normal range of motion. He exhibits no edema and no tenderness.  Lymphadenopathy:    He has no  cervical adenopathy.  Neurological: He is alert and oriented to person, place, and time. He has  normal strength. He displays no atrophy, no tremor and normal reflexes. No cranial nerve deficit or sensory deficit. He exhibits normal muscle tone. He displays a negative Romberg sign. He displays no seizure activity. Coordination and gait normal. He displays no Babinski's sign on the right side. He displays no Babinski's sign on the left side.  Reflex Scores:      Tricep reflexes are 1+ on the right side and 1+ on the left side.      Bicep reflexes are 1+ on the right side and 1+ on the left side.      Brachioradialis reflexes are 1+ on the right side and 1+ on the left side.      Patellar reflexes are 1+ on the right side and 1+ on the left side.      Achilles reflexes are 1+ on the right side and 1+ on the left side. Skin: Skin is warm and dry. No rash noted. He is not diaphoretic. No erythema. No pallor.  Psychiatric: He has a normal mood and affect. His behavior is normal. Judgment and thought content normal.      Lab Results  Component Value Date   WBC 12.7* 07/04/2010   HGB 14.6 07/04/2010   HCT 42.1 07/04/2010   PLT 307.0 07/04/2010   GLUCOSE 87 07/04/2010   CHOL 170 07/04/2010   TRIG 357.0* 07/04/2010   HDL 35.50* 07/04/2010   LDLDIRECT 109.3 07/04/2010   LDLCALC 115* 10/30/2006   ALT 19 07/04/2010   AST 19 07/04/2010   NA 141 07/04/2010   K 3.7 07/04/2010   CL 100 07/04/2010   CREATININE 0.8 07/04/2010   BUN 8 07/04/2010   CO2 28 07/04/2010   TSH 1.47 07/04/2010   PSA 1.13 07/04/2010   HGBA1C 6.4 07/04/2010   MICROALBUR 3.4* 07/04/2010      Assessment & Plan:

## 2011-03-24 ENCOUNTER — Ambulatory Visit (INDEPENDENT_AMBULATORY_CARE_PROVIDER_SITE_OTHER)
Admission: RE | Admit: 2011-03-24 | Discharge: 2011-03-24 | Disposition: A | Discharge status: Home / Self Care | Payer: Commercial Managed Care - PPO | Source: Ambulatory Visit | Attending: Internal Medicine | Admitting: Internal Medicine

## 2011-03-24 DIAGNOSIS — R51 Headache: Secondary | ICD-10-CM

## 2012-05-22 HISTORY — PX: SPINAL CORD STIMULATOR IMPLANT: SHX2422

## 2013-08-11 ENCOUNTER — Emergency Department (HOSPITAL_BASED_OUTPATIENT_CLINIC_OR_DEPARTMENT_OTHER): Payer: 59

## 2013-08-11 ENCOUNTER — Encounter (HOSPITAL_BASED_OUTPATIENT_CLINIC_OR_DEPARTMENT_OTHER): Payer: Self-pay | Admitting: Emergency Medicine

## 2013-08-11 ENCOUNTER — Emergency Department (HOSPITAL_BASED_OUTPATIENT_CLINIC_OR_DEPARTMENT_OTHER)
Admission: EM | Admit: 2013-08-11 | Discharge: 2013-08-11 | Disposition: A | Discharge status: Home / Self Care | Payer: 59 | Attending: Emergency Medicine | Admitting: Emergency Medicine

## 2013-08-11 DIAGNOSIS — I10 Essential (primary) hypertension: Secondary | ICD-10-CM | POA: Insufficient documentation

## 2013-08-11 DIAGNOSIS — G894 Chronic pain syndrome: Secondary | ICD-10-CM | POA: Insufficient documentation

## 2013-08-11 DIAGNOSIS — R209 Unspecified disturbances of skin sensation: Secondary | ICD-10-CM | POA: Insufficient documentation

## 2013-08-11 DIAGNOSIS — E785 Hyperlipidemia, unspecified: Secondary | ICD-10-CM | POA: Insufficient documentation

## 2013-08-11 DIAGNOSIS — G43909 Migraine, unspecified, not intractable, without status migrainosus: Secondary | ICD-10-CM | POA: Insufficient documentation

## 2013-08-11 DIAGNOSIS — F411 Generalized anxiety disorder: Secondary | ICD-10-CM | POA: Insufficient documentation

## 2013-08-11 DIAGNOSIS — F3289 Other specified depressive episodes: Secondary | ICD-10-CM | POA: Insufficient documentation

## 2013-08-11 DIAGNOSIS — R0789 Other chest pain: Secondary | ICD-10-CM | POA: Insufficient documentation

## 2013-08-11 DIAGNOSIS — Z8659 Personal history of other mental and behavioral disorders: Secondary | ICD-10-CM | POA: Insufficient documentation

## 2013-08-11 DIAGNOSIS — E119 Type 2 diabetes mellitus without complications: Secondary | ICD-10-CM | POA: Insufficient documentation

## 2013-08-11 DIAGNOSIS — Z79899 Other long term (current) drug therapy: Secondary | ICD-10-CM | POA: Insufficient documentation

## 2013-08-11 DIAGNOSIS — F329 Major depressive disorder, single episode, unspecified: Secondary | ICD-10-CM | POA: Insufficient documentation

## 2013-08-11 DIAGNOSIS — K219 Gastro-esophageal reflux disease without esophagitis: Secondary | ICD-10-CM | POA: Insufficient documentation

## 2013-08-11 DIAGNOSIS — F17200 Nicotine dependence, unspecified, uncomplicated: Secondary | ICD-10-CM | POA: Insufficient documentation

## 2013-08-11 LAB — CBC WITH DIFFERENTIAL/PLATELET
BASOS ABS: 0.1 10*3/uL (ref 0.0–0.1)
BASOS PCT: 1 % (ref 0–1)
EOS PCT: 1 % (ref 0–5)
Eosinophils Absolute: 0.1 10*3/uL (ref 0.0–0.7)
HEMATOCRIT: 41.2 % (ref 39.0–52.0)
Hemoglobin: 14.3 g/dL (ref 13.0–17.0)
Lymphocytes Relative: 32 % (ref 12–46)
Lymphs Abs: 2.9 10*3/uL (ref 0.7–4.0)
MCH: 30.8 pg (ref 26.0–34.0)
MCHC: 34.7 g/dL (ref 30.0–36.0)
MCV: 88.6 fL (ref 78.0–100.0)
Monocytes Absolute: 0.7 10*3/uL (ref 0.1–1.0)
Monocytes Relative: 7 % (ref 3–12)
NEUTROS ABS: 5.4 10*3/uL (ref 1.7–7.7)
NEUTROS PCT: 60 % (ref 43–77)
Platelets: 275 10*3/uL (ref 150–400)
RBC: 4.65 MIL/uL (ref 4.22–5.81)
RDW: 13.1 % (ref 11.5–15.5)
WBC: 9 10*3/uL (ref 4.0–10.5)

## 2013-08-11 LAB — TROPONIN I: Troponin I: <0.3 ng/mL (ref <0.30)

## 2013-08-11 LAB — BASIC METABOLIC PANEL
BUN: 6 mg/dL (ref 6–23)
CHLORIDE: 107 meq/L (ref 96–112)
CO2: 24 mEq/L (ref 19–32)
CREATININE: 0.9 mg/dL (ref 0.50–1.35)
Calcium: 9.1 mg/dL (ref 8.4–10.5)
GFR calc Af Amer: >90 mL/min (ref >90)
Glucose, Bld: 132 mg/dL — ABNORMAL HIGH (ref 70–99)
Potassium: 3.9 mEq/L (ref 3.7–5.3)
SODIUM: 143 meq/L (ref 137–147)

## 2013-08-11 LAB — URINALYSIS, ROUTINE W REFLEX MICROSCOPIC
BILIRUBIN URINE: NEGATIVE
GLUCOSE, UA: NEGATIVE mg/dL
Hgb urine dipstick: NEGATIVE
KETONES UR: NEGATIVE mg/dL
LEUKOCYTES UA: NEGATIVE
Nitrite: NEGATIVE
Protein, ur: NEGATIVE mg/dL
SPECIFIC GRAVITY, URINE: 1.004 — AB (ref 1.005–1.030)
UROBILINOGEN UA: 0.2 mg/dL (ref 0.0–1.0)
pH: 8 (ref 5.0–8.0)

## 2013-08-11 LAB — HEPATIC FUNCTION PANEL
ALK PHOS: 94 U/L (ref 39–117)
ALT: 14 U/L (ref 0–53)
AST: 16 U/L (ref 0–37)
Albumin: 3.8 g/dL (ref 3.5–5.2)
Total Protein: 6.8 g/dL (ref 6.0–8.3)

## 2013-08-11 LAB — LIPASE, BLOOD: LIPASE: 40 U/L (ref 11–59)

## 2013-08-11 MED ORDER — MORPHINE SULFATE 2 MG/ML IJ SOLN: 2.0000 mg | Freq: Once | INTRAMUSCULAR | Status: DC

## 2013-08-11 MED ORDER — ASPIRIN EC 325 MG PO TBEC
325.0000 mg | DELAYED_RELEASE_TABLET | Freq: Once | ORAL | Status: AC
  Administered 2013-08-11: 325 mg via ORAL
  Filled 2013-08-11: qty 1

## 2013-08-11 MED ORDER — KETOROLAC TROMETHAMINE 30 MG/ML IJ SOLN
30.0000 mg | Freq: Once | INTRAMUSCULAR | Status: AC
  Administered 2013-08-11: 30 mg via INTRAVENOUS
  Filled 2013-08-11: qty 1

## 2013-08-11 MED ORDER — MORPHINE SULFATE 4 MG/ML IJ SOLN
4.0000 mg | Freq: Once | INTRAMUSCULAR | Status: AC
  Administered 2013-08-11: 4 mg via INTRAVENOUS
  Filled 2013-08-11: qty 1

## 2013-08-11 NOTE — ED Provider Notes (Signed)
CSN: [id number]     Arrival date & time 08/11/13  4098 History   First MD Initiated Contact with Patient 08/11/13 [phone]     Chief Complaint  Patient presents with  . Chest Pain  . Numbness   Patient is a 48 y.o. male presenting with chest pain.  Chest Pain Associated symptoms: dizziness, shortness of breath and weakness   Associated symptoms: no abdominal pain, no diaphoresis, no fatigue, no fever, no nausea, no palpitations and not vomiting    Peter Zimmerman is 48 y.o. male with history of hypertension, diabetes, anxiety and GERD that presented to the ED with chest pain since 6 AM. Patient reports that his chest pain started this morning at 6 AM at the same time his left arm became numb from his shoulder to his fingertips, as well as he became nauseous. Initially he thought maybe he slept on his shoulder around and proceeded to drive to work. During his tried to work left arm became weak and was progressively getting worse with numbness. He reports his chest pain is reproducible and non radiating, located in the left sternal border and stabbing in nature. He reports it has been constant since 6 AM has not become worse. He reports mild shortness of breath and lightheadedness that started at the same time as his other symptoms. Patient isn't every day smoker. She does have a history of anxiety and is prescribed Ativan 3 times a day. He states that he is a high strung person by nature and he is going through a lot of financial hardship, currently creating a lot of stress in his life. Patient states he has never had a cardiac arrest or chest pain like this in the past. However he does admit to having a stress test about 10 years ago that was normal.  Past Medical History  Diagnosis Date  . Hypertension   . Diabetes mellitus   . Anxiety   . Depression   . GERD (gastroesophageal reflux disease)   . Hyperlipidemia   . Chronic pain syndrome   . Suicidal ideation     history off  . Migraine 2005    Past Surgical History  Procedure Laterality Date  . Right tibial surgery      x 3   Family History  Problem Relation Age of Onset  . Hypertension Other    History  Substance Use Topics  . Smoking status: Current Every Day Smoker  . Smokeless tobacco: Not on file  . Alcohol Use: No    Review of Systems  Constitutional: Negative for fever, chills, diaphoresis, activity change, appetite change and fatigue.  Respiratory: Positive for chest tightness and shortness of breath.   Cardiovascular: Positive for chest pain. Negative for palpitations and leg swelling.  Gastrointestinal: Negative for nausea, vomiting, abdominal pain, diarrhea, constipation and abdominal distention.  Genitourinary: Negative for dysuria and hematuria.  Neurological: Positive for dizziness and weakness. Negative for syncope.  Psychiatric/Behavioral: The patient is nervous/anxious.     Allergies  Methadone hcl  Home Medications   Current Outpatient Rx  Name  Route  Sig  Dispense  Refill  . topiramate (TOPAMAX) 100 MG tablet   Oral   Take 100 mg by mouth 2 (two) times daily.         . DULoxetine (CYMBALTA) 60 MG capsule   Oral   Take 60 mg by mouth daily.           Marland Kitchen EXPIRED: eletriptan (RELPAX) 40 MG tablet  Oral   One tablet by mouth as needed for migraine headache.  If the headache improves and then returns, dose may be repeated after 2 hours have elapsed since first dose (do not exceed 80 mg per day). may repeat in 2 hours if necessary   10 tablet   0   . ibuprofen (ADVIL) 200 MG tablet   Oral   Take 200 mg by mouth daily as needed.           Marland Kitchen lisinopril (PRINIVIL,ZESTRIL) 20 MG tablet   Oral   Take 20 mg by mouth daily.           Marland Kitchen LORazepam (ATIVAN) 2 MG tablet   Oral   Take 2 mg by mouth 3 (three) times daily.           Marland Kitchen omeprazole (PRILOSEC) 20 MG capsule   Oral   Take 20 mg by mouth daily.           Marland Kitchen oxyCODONE-acetaminophen (PERCOCET) 10-325 MG per tablet    Oral   Take 1 tablet by mouth.            BP 130/74  Pulse 73  Temp(Src) 97.9 F (36.6 C) (Oral)  Resp 74  Ht 6\' 1"  (1.854 m)  Wt 195 lb (88.451 kg)  BMI 25.73 kg/m2  SpO2 100% Physical Exam Gen: NAD. Nontoxic in appearance patient appears anxious. 100% on room air HEENT: AT. [place].  Bilateral eyes without injections or icterus. MMM. Bilateral nares normal. Throat without erythema or exudates.  CV: RRR no murmurs, clicks, gallops or rubs Chest: CTAB, no wheeze or crackles; mildly diminished breath sounds. Tender to palpation over left sternal border. Abd: Soft. ND. Tender to palpation over left upper quadrant, left lower cord and right lower quadrant. No rebound or guarding. BS present. No Masses palpated.  Ext: No erythema. No edema.  Skin: No rashes, purpura or petechiae.  Neuro:  Normal gait. PERLA. EOMi. Alert. Grossly intact. No pronator drift. Psych: Patient appears anxious.  ED Course  Procedures (including critical care time) Labs Review Labs Reviewed  BASIC METABOLIC PANEL - Abnormal; Notable for the following:    Glucose, Bld 132 (*)    All other components within normal limits  URINALYSIS, ROUTINE W REFLEX MICROSCOPIC - Abnormal; Notable for the following:    Specific Gravity, Urine 1.004 (*)    All other components within normal limits  HEPATIC FUNCTION PANEL - Abnormal; Notable for the following:    Total Bilirubin <0.2 (*)    All other components within normal limits  CBC WITH DIFFERENTIAL  TROPONIN I  LIPASE, BLOOD  TROPONIN I   Imaging Review Dg Chest 2 View  08/11/2013   CLINICAL DATA:  Chest pain  EXAM: CHEST  2 VIEW  COMPARISON:  12/29/2004  FINDINGS: Heart size and vascularity are normal. Lungs are clear. There is mild hyperinflation. Interval placement of spinal cord stimulator.  IMPRESSION: Pulmonary hyperinflation.  No acute abnormality.   Electronically Signed   By: Marlan Palau M.D.   On: 08/11/2013 10:39     EKG Interpretation   Date/Time:   Monday August 11 2013 09:39:07 EDT Ventricular Rate:  68 PR Interval:  124 QRS Duration: 90 QT Interval:  406 QTC Calculation: 431 R Axis:   78 Text Interpretation:  Normal sinus rhythm Normal ECG Confirmed by DELOS   MD, DOUGLAS ([id number]) on 08/11/2013 9:46:06 AM      MDM   Final diagnoses:  Non-cardiac chest pain  Patient presented to the ED with chest pain that was reproducible and nonradiating. In addition patient is complaining of left arm numbness from shoulder to fingertips. Troponin x2 was negative. EKG was in normal sinus rhythm. CBC, BMP, lipase and UA were all normal. ASA 325 given. Patient was given morphine with only minimal relief. Toradol was administered with minimal relief. Patient's vitals remained stable, 100% on room air. He is likely suffering general anxiety disorder and history. Patient is encouraged to follow PCP within 1-2 days ago every day medication for general anxiety disorder. Work excuse provided for today.  Natalia Leatherwoodenee A Makaelyn Aponte, DO 08/11/13 1239

## 2013-08-11 NOTE — ED Notes (Signed)
Pt c/o left chest pain feels like he was "punched" and sore and left arm numb. Pt sts pain started at 0600 today and has been constant. Pt sts he is under more stress than usual. Pt also [place] a pain management provider [place]

## 2013-08-11 NOTE — ED Notes (Signed)
Pt states "I don't know why the fuck you are giving me motrin! I need something stronger than that shit!" explained to pt that the resident has ordered toradol, which is not motrin. Pt states 'well, I don't want to pay for that shit, because it's not gonna help anyway. I need something strong. That morphine is bullshit too. They always give me something strong for my pain!" pt encouraged to take toradol to see if it helps with his pain, finally agrees to take it, "I'm gonna have to pay for it anyway!"

## 2013-08-11 NOTE — ED Notes (Signed)
Pt resting quietly in nad. Pt denies any relief of his pain, pt states "I told you that shit don't work for me. I need something stronger."

## 2013-08-11 NOTE — Discharge Instructions (Signed)
Chest Pain (Nonspecific) Chest pain has many causes. Your pain could be caused by something serious, such as a heart attack or a blood clot in the lungs. It could also be caused by something less serious, such as a chest bruise or a virus. Follow up with your doctor. More lab tests or other studies may be needed to find the cause of your pain. Most of the time, nonspecific chest pain will improve within 2 to 3 days of rest and mild pain medicine. HOME CARE  For chest bruises, you may put ice on the sore area for 15-20 minutes, 03-04 times a day. Do this only if it makes you feel better.  Put ice in a plastic bag.  Place a towel between the skin and the bag.  Rest for the next 2 to 3 days.  Go back to work if the pain improves.  See your doctor if the pain lasts longer than 1 to 2 weeks.  Only take medicine as told by your doctor.  Quit smoking if you smoke. GET HELP RIGHT AWAY IF:   There is more pain or pain that spreads to the arm, neck, jaw, back, or belly (abdomen).  You have shortness of breath.  You cough more than usual or cough up blood.  You have very bad back or belly pain, feel sick to your stomach (nauseous), or throw up (vomit).  You have very bad weakness.  You pass out (faint).  You have a fever. Any of these problems may be serious and may be an emergency. Do not wait to see if the problems will go away. Get medical help right away. Call your local emergency services 911 in [place] Do not drive yourself to the hospital. MAKE SURE YOU:   Understand these instructions.  Will watch this condition.  Will get help right away if you or your child is not doing well or gets worse. Document Released: 10/25/2007 Document Revised: 07/31/2011 Document Reviewed: 10/25/2007 [facility] Patient Information 2014 [place], [place]. Generalized Anxiety Disorder Generalized anxiety disorder (GAD) is a mental disorder. It interferes with life functions, including relationships,  work, and school. GAD is different from normal anxiety, which everyone experiences at some point in their lives in response to specific life events and activities. Normal anxiety actually helps us prepare for and get through these life events and activities. Normal anxiety goes away after the event or activity is over.  GAD causes anxiety that is not necessarily related to specific events or activities. It also causes excess anxiety in proportion to specific events or activities. The anxiety associated with GAD is also difficult to control. GAD can vary from mild to severe. People with severe GAD can have intense waves of anxiety with physical symptoms (panic attacks).  SYMPTOMS The anxiety and worry associated with GAD are difficult to control. This anxiety and worry are related to many life events and activities and also occur more days than not for 6 months or longer. People with GAD also have three or more of the following symptoms (one or more in children):  Restlessness.   Fatigue.  Difficulty concentrating.   Irritability.  Muscle tension.  Difficulty sleeping or unsatisfying sleep. DIAGNOSIS GAD is diagnosed through an assessment by your caregiver. Your caregiver will ask you questions aboutyour mood,physical symptoms, and events in your life. Your caregiver may ask you about your medical history and use of alcohol or drugs, including prescription medications. Your caregiver may also do a physical exam and blood tests.  Certain medical conditions and the use of certain substances can cause symptoms similar to those associated with GAD. Your caregiver may refer you to a mental health specialist for further evaluation. TREATMENT The following therapies are usually used to treat GAD:   Medication Antidepressant medication usually is prescribed for long-term daily control. Antianxiety medications may be added in severe cases, especially when panic attacks occur.   Talk therapy  (psychotherapy) Certain types of talk therapy can be helpful in treating GAD by providing support, education, and guidance. A form of talk therapy called cognitive behavioral therapy can teach you healthy ways to think about and react to daily life events and activities.  Stress managementtechniques These include yoga, meditation, and exercise and can be very helpful when they are practiced regularly. A mental health specialist can help determine which treatment is best for you. Some people see improvement with one therapy. However, other people require a combination of therapies. Document Released: 09/02/2012 Document Reviewed: 09/02/2012 [facility] Patient Information 2014 [place], [place].

## 2013-08-12 NOTE — ED Provider Notes (Signed)
I saw and evaluated the patient, reviewed the resident's note and I agree with the findings and plan. Patient is a 48 year old male with history of hypertension, diabetes, anxiety, and chronic back pain. He presents today with complaints of discomfort in his chest that started this morning. The pain is in the left upper chest and left shoulder. He felt some shortness of breath, nausea along with his symptoms. He denies any cough, fever, or chills. He denies any recent exertional symptoms and has no prior cardiac history the exception of a normal stress test 10 years ago.  On exam, vitals are stable and the patient is afebrile. Heart is regular rate and rhythm and lungs are clear. Abdomen is soft, nontender. There is tenderness to palpation to the left upper chest wall which appears to reproduce his symptoms. Extremities are without edema.  Workup reveals a normal EKG, unremarkable laboratory studies, and negative troponin x2. His symptoms resolved with Toradol and he appears quite stable. His symptoms are reproducible with palpation of the chest wall and I doubt a cardiac etiology. I feel as though he is appropriate for discharge with outpatient followup and when necessary return.   EKG Interpretation   Date/Time:  Monday August 11 2013 09:39:07 EDT Ventricular Rate:  68 PR Interval:  124 QRS Duration: 90 QT Interval:  406 QTC Calculation: 431 R Axis:   78 Text Interpretation:  Normal sinus rhythm Normal ECG Confirmed by Malva CoganELOS   MD, Nastassia Bazaldua ([id number]) on 08/11/2013 9:46:06 AM       Geoffery Lyonsouglas Janet Decesare, MD 08/12/13 [phone]

## 2014-02-05 ENCOUNTER — Other Ambulatory Visit: Payer: Self-pay | Admitting: Orthopedic Surgery

## 2014-02-20 ENCOUNTER — Inpatient Hospital Stay (HOSPITAL_COMMUNITY): Admission: RE | Admit: 2014-02-20 | Payer: 59 | Source: Ambulatory Visit

## 2014-02-24 ENCOUNTER — Encounter (HOSPITAL_COMMUNITY)
Admission: RE | Admit: 2014-02-24 | Discharge: 2014-02-24 | Disposition: A | Discharge status: Home / Self Care | Payer: 59 | Source: Ambulatory Visit | Attending: Orthopedic Surgery | Admitting: Orthopedic Surgery

## 2014-02-24 ENCOUNTER — Encounter (HOSPITAL_COMMUNITY): Payer: Self-pay

## 2014-02-24 DIAGNOSIS — M238X1 Other internal derangements of right knee: Secondary | ICD-10-CM | POA: Diagnosis not present

## 2014-02-24 DIAGNOSIS — Z0000 Encounter for general adult medical examination without abnormal findings: Secondary | ICD-10-CM | POA: Insufficient documentation

## 2014-02-24 DIAGNOSIS — F329 Major depressive disorder, single episode, unspecified: Secondary | ICD-10-CM | POA: Diagnosis not present

## 2014-02-24 DIAGNOSIS — G894 Chronic pain syndrome: Secondary | ICD-10-CM | POA: Insufficient documentation

## 2014-02-24 DIAGNOSIS — I10 Essential (primary) hypertension: Secondary | ICD-10-CM | POA: Diagnosis not present

## 2014-02-24 DIAGNOSIS — E785 Hyperlipidemia, unspecified: Secondary | ICD-10-CM | POA: Insufficient documentation

## 2014-02-24 DIAGNOSIS — G4700 Insomnia, unspecified: Secondary | ICD-10-CM | POA: Insufficient documentation

## 2014-02-24 DIAGNOSIS — F419 Anxiety disorder, unspecified: Secondary | ICD-10-CM | POA: Diagnosis not present

## 2014-02-24 DIAGNOSIS — E119 Type 2 diabetes mellitus without complications: Secondary | ICD-10-CM | POA: Insufficient documentation

## 2014-02-24 DIAGNOSIS — G43909 Migraine, unspecified, not intractable, without status migrainosus: Secondary | ICD-10-CM | POA: Insufficient documentation

## 2014-02-24 DIAGNOSIS — M609 Myositis, unspecified: Secondary | ICD-10-CM | POA: Diagnosis not present

## 2014-02-24 DIAGNOSIS — K219 Gastro-esophageal reflux disease without esophagitis: Secondary | ICD-10-CM | POA: Diagnosis not present

## 2014-02-24 LAB — BASIC METABOLIC PANEL
Anion gap: 11 (ref 5–15)
BUN: 9 mg/dL (ref 6–23)
CO2: 25 mEq/L (ref 19–32)
CREATININE: 0.86 mg/dL (ref 0.50–1.35)
Calcium: 9.3 mg/dL (ref 8.4–10.5)
Chloride: 102 mEq/L (ref 96–112)
GFR calc Af Amer: >90 mL/min (ref >90)
GLUCOSE: 113 mg/dL — AB (ref 70–99)
POTASSIUM: 4 meq/L (ref 3.7–5.3)
Sodium: 138 mEq/L (ref 137–147)

## 2014-02-24 LAB — CBC
HCT: 43.6 % (ref 39.0–52.0)
Hemoglobin: 15 g/dL (ref 13.0–17.0)
MCH: 30.5 pg (ref 26.0–34.0)
MCHC: 34.4 g/dL (ref 30.0–36.0)
MCV: 88.6 fL (ref 78.0–100.0)
PLATELETS: 250 10*3/uL (ref 150–400)
RBC: 4.92 MIL/uL (ref 4.22–5.81)
RDW: 12.7 % (ref 11.5–15.5)
WBC: 7.2 10*3/uL (ref 4.0–10.5)

## 2014-02-24 MED ORDER — CHLORHEXIDINE GLUCONATE 4 % EX LIQD: 60.0000 mL | Freq: Once | CUTANEOUS | Status: DC

## 2014-02-27 NOTE — Progress Notes (Signed)
Pt returned phone call, verbalized understanding to arrive at 1100.

## 2014-02-27 NOTE — Progress Notes (Signed)
Message left for Casimiro NeedleMichael to return call due to change in time of surgery on Mon morning. To return call.

## 2014-03-01 MED ORDER — CEFAZOLIN SODIUM-DEXTROSE 2-3 GM-% IV SOLR
2.0000 g | INTRAVENOUS | Status: AC
  Administered 2014-03-02: 2 g via INTRAVENOUS
  Filled 2014-03-01: qty 50

## 2014-03-02 ENCOUNTER — Encounter (HOSPITAL_COMMUNITY): Payer: Self-pay | Admitting: *Deleted

## 2014-03-02 ENCOUNTER — Encounter (HOSPITAL_COMMUNITY): Payer: 59 | Admitting: Vascular Surgery

## 2014-03-02 ENCOUNTER — Encounter (HOSPITAL_COMMUNITY)
Admission: RE | Disposition: A | Discharge status: Home / Self Care | Payer: Self-pay | Source: Ambulatory Visit | Attending: Orthopedic Surgery

## 2014-03-02 ENCOUNTER — Ambulatory Visit (HOSPITAL_COMMUNITY): Payer: 59 | Admitting: Vascular Surgery

## 2014-03-02 ENCOUNTER — Ambulatory Visit (HOSPITAL_COMMUNITY)
Admission: RE | Admit: 2014-03-02 | Discharge: 2014-03-02 | Disposition: A | Discharge status: Home / Self Care | Payer: 59 | Source: Ambulatory Visit | Attending: Orthopedic Surgery | Admitting: Orthopedic Surgery

## 2014-03-02 DIAGNOSIS — Y929 Unspecified place or not applicable: Secondary | ICD-10-CM | POA: Diagnosis not present

## 2014-03-02 DIAGNOSIS — F419 Anxiety disorder, unspecified: Secondary | ICD-10-CM | POA: Insufficient documentation

## 2014-03-02 DIAGNOSIS — M172 Bilateral post-traumatic osteoarthritis of knee: Secondary | ICD-10-CM | POA: Insufficient documentation

## 2014-03-02 DIAGNOSIS — M797 Fibromyalgia: Secondary | ICD-10-CM | POA: Diagnosis not present

## 2014-03-02 DIAGNOSIS — M25561 Pain in right knee: Secondary | ICD-10-CM | POA: Diagnosis present

## 2014-03-02 DIAGNOSIS — K219 Gastro-esophageal reflux disease without esophagitis: Secondary | ICD-10-CM | POA: Diagnosis not present

## 2014-03-02 DIAGNOSIS — F329 Major depressive disorder, single episode, unspecified: Secondary | ICD-10-CM | POA: Insufficient documentation

## 2014-03-02 DIAGNOSIS — S83241A Other tear of medial meniscus, current injury, right knee, initial encounter: Secondary | ICD-10-CM | POA: Insufficient documentation

## 2014-03-02 DIAGNOSIS — F17210 Nicotine dependence, cigarettes, uncomplicated: Secondary | ICD-10-CM | POA: Insufficient documentation

## 2014-03-02 DIAGNOSIS — E785 Hyperlipidemia, unspecified: Secondary | ICD-10-CM | POA: Insufficient documentation

## 2014-03-02 DIAGNOSIS — Z888 Allergy status to other drugs, medicaments and biological substances status: Secondary | ICD-10-CM | POA: Diagnosis not present

## 2014-03-02 DIAGNOSIS — X58XXXA Exposure to other specified factors, initial encounter: Secondary | ICD-10-CM | POA: Diagnosis not present

## 2014-03-02 DIAGNOSIS — E119 Type 2 diabetes mellitus without complications: Secondary | ICD-10-CM | POA: Diagnosis not present

## 2014-03-02 DIAGNOSIS — I10 Essential (primary) hypertension: Secondary | ICD-10-CM | POA: Diagnosis not present

## 2014-03-02 DIAGNOSIS — S83281A Other tear of lateral meniscus, current injury, right knee, initial encounter: Secondary | ICD-10-CM | POA: Diagnosis not present

## 2014-03-02 HISTORY — PX: KNEE ARTHROSCOPY: SHX127

## 2014-03-02 LAB — GLUCOSE, CAPILLARY: Glucose-Capillary: 104 mg/dL — ABNORMAL HIGH (ref 70–99)

## 2014-03-02 SURGERY — ARTHROSCOPY, KNEE
Anesthesia: Regional | Site: Knee | Laterality: Right

## 2014-03-02 MED ORDER — OXYCODONE-ACETAMINOPHEN 10-325 MG PO TABS
1.0000 | ORAL_TABLET | Freq: Four times a day (QID) | ORAL | Status: DC | PRN
Start: 2014-03-02 — End: 2015-02-25

## 2014-03-02 MED ORDER — ONDANSETRON HCL 4 MG/2ML IJ SOLN
INTRAMUSCULAR | Status: AC
  Administered 2014-03-02: 4 mg via INTRAVENOUS
  Filled 2014-03-02: qty 2

## 2014-03-02 MED ORDER — LACTATED RINGERS IV SOLN
INTRAVENOUS | Status: DC | PRN
  Administered 2014-03-02: 15:00:00 via INTRAVENOUS

## 2014-03-02 MED ORDER — ROPIVACAINE HCL 5 MG/ML IJ SOLN
INTRAMUSCULAR | Status: DC | PRN
  Administered 2014-03-02: 20 mL via PERINEURAL

## 2014-03-02 MED ORDER — SODIUM CHLORIDE 0.9 % IR SOLN
Status: DC | PRN
  Administered 2014-03-02: 3000 mL

## 2014-03-02 MED ORDER — LACTATED RINGERS IV SOLN
INTRAVENOUS | Status: DC
  Administered 2014-03-02: 11:00:00 via INTRAVENOUS

## 2014-03-02 MED ORDER — LIDOCAINE HCL (CARDIAC) 20 MG/ML IV SOLN
INTRAVENOUS | Status: AC
  Filled 2014-03-02: qty 5

## 2014-03-02 MED ORDER — ACETAMINOPHEN 325 MG PO TABS: 325.0000 mg | ORAL_TABLET | ORAL | Status: DC | PRN

## 2014-03-02 MED ORDER — OXYCODONE HCL 5 MG PO TABS
5.0000 mg | ORAL_TABLET | Freq: Once | ORAL | Status: AC | PRN
  Administered 2014-03-02: 5 mg via ORAL

## 2014-03-02 MED ORDER — FENTANYL CITRATE 0.05 MG/ML IJ SOLN
INTRAMUSCULAR | Status: DC | PRN
  Administered 2014-03-02: 100 ug via INTRAVENOUS

## 2014-03-02 MED ORDER — ONDANSETRON HCL 4 MG/2ML IJ SOLN
INTRAMUSCULAR | Status: AC
  Filled 2014-03-02: qty 2

## 2014-03-02 MED ORDER — MIDAZOLAM HCL 2 MG/2ML IJ SOLN
INTRAMUSCULAR | Status: AC
  Filled 2014-03-02: qty 2

## 2014-03-02 MED ORDER — MIDAZOLAM HCL 2 MG/2ML IJ SOLN
INTRAMUSCULAR | Status: AC
  Administered 2014-03-02: 1 mg
  Filled 2014-03-02: qty 2

## 2014-03-02 MED ORDER — LIDOCAINE HCL (CARDIAC) 20 MG/ML IV SOLN
INTRAVENOUS | Status: DC | PRN
  Administered 2014-03-02: 80 mg via INTRAVENOUS

## 2014-03-02 MED ORDER — ONDANSETRON HCL 4 MG/2ML IJ SOLN
4.0000 mg | Freq: Once | INTRAMUSCULAR | Status: AC
  Administered 2014-03-02: 4 mg via INTRAVENOUS

## 2014-03-02 MED ORDER — ONDANSETRON HCL 4 MG/2ML IJ SOLN
INTRAMUSCULAR | Status: DC | PRN
  Administered 2014-03-02: 4 mg via INTRAVENOUS

## 2014-03-02 MED ORDER — FENTANYL CITRATE 0.05 MG/ML IJ SOLN
INTRAMUSCULAR | Status: AC
  Administered 2014-03-02: 100 ug via INTRAVENOUS
  Filled 2014-03-02: qty 2

## 2014-03-02 MED ORDER — ACETAMINOPHEN 160 MG/5ML PO SOLN: 325.0000 mg | ORAL | Status: DC | PRN

## 2014-03-02 MED ORDER — HYDROMORPHONE HCL 1 MG/ML IJ SOLN
0.2500 mg | INTRAMUSCULAR | Status: DC | PRN
  Administered 2014-03-02 (×2): 0.5 mg via INTRAVENOUS

## 2014-03-02 MED ORDER — FENTANYL CITRATE 0.05 MG/ML IJ SOLN
INTRAMUSCULAR | Status: AC
  Filled 2014-03-02: qty 5

## 2014-03-02 MED ORDER — ONDANSETRON HCL 4 MG PO TABS: 4.0000 mg | ORAL_TABLET | Freq: Four times a day (QID) | ORAL | Status: DC | PRN

## 2014-03-02 MED ORDER — ONDANSETRON HCL 4 MG/2ML IJ SOLN: 4.0000 mg | Freq: Four times a day (QID) | INTRAMUSCULAR | Status: DC | PRN

## 2014-03-02 MED ORDER — CEFAZOLIN SODIUM-DEXTROSE 2-3 GM-% IV SOLR
INTRAVENOUS | Status: DC | PRN
  Administered 2014-03-02: 2 g via INTRAVENOUS

## 2014-03-02 MED ORDER — FENTANYL CITRATE 0.05 MG/ML IJ SOLN
50.0000 ug | INTRAMUSCULAR | Status: DC | PRN
Start: 2014-03-02 — End: 2014-03-02
  Administered 2014-03-02 (×2): 100 ug via INTRAVENOUS
  Filled 2014-03-02: qty 2

## 2014-03-02 MED ORDER — HYDROMORPHONE HCL 1 MG/ML IJ SOLN
INTRAMUSCULAR | Status: AC
  Filled 2014-03-02: qty 1

## 2014-03-02 MED ORDER — METOCLOPRAMIDE HCL 5 MG PO TABS: 5.0000 mg | ORAL_TABLET | Freq: Three times a day (TID) | ORAL | Status: DC | PRN

## 2014-03-02 MED ORDER — OXYCODONE HCL 5 MG PO TABS
ORAL_TABLET | ORAL | Status: AC
  Filled 2014-03-02: qty 1

## 2014-03-02 MED ORDER — PROPOFOL 10 MG/ML IV BOLUS
INTRAVENOUS | Status: DC | PRN
  Administered 2014-03-02: 200 mg via INTRAVENOUS

## 2014-03-02 MED ORDER — METOCLOPRAMIDE HCL 5 MG/ML IJ SOLN: 5.0000 mg | Freq: Three times a day (TID) | INTRAMUSCULAR | Status: DC | PRN

## 2014-03-02 MED ORDER — PROPOFOL 10 MG/ML IV BOLUS
INTRAVENOUS | Status: AC
  Filled 2014-03-02: qty 20

## 2014-03-02 MED ORDER — OXYCODONE HCL 5 MG/5ML PO SOLN: 5.0000 mg | Freq: Once | ORAL | Status: AC | PRN

## 2014-03-02 SURGICAL SUPPLY — 35 items
BANDAGE ELASTIC 6 VELCRO ST LF (GAUZE/BANDAGES/DRESSINGS) ×3 IMPLANT
BLADE GREAT WHITE 4.2 (BLADE) ×2 IMPLANT
BLADE GREAT WHITE 4.2MM (BLADE) ×1
DRAPE ARTHROSCOPY W/POUCH 114 (DRAPES) ×3 IMPLANT
DRAPE U-SHAPE 47X51 STRL (DRAPES) ×3 IMPLANT
ELECT REM PT RETURN 9FT ADLT (ELECTROSURGICAL)
ELECTRODE REM PT RTRN 9FT ADLT (ELECTROSURGICAL) IMPLANT
GAUZE SPONGE 4X4 12PLY STRL (GAUZE/BANDAGES/DRESSINGS) ×3 IMPLANT
GAUZE XEROFORM 1X8 LF (GAUZE/BANDAGES/DRESSINGS) ×3 IMPLANT
GLOVE BIOGEL PI IND STRL 7.0 (GLOVE) IMPLANT
GLOVE BIOGEL PI IND STRL 8.5 (GLOVE) ×2 IMPLANT
GLOVE BIOGEL PI INDICATOR 7.0 (GLOVE) ×2
GLOVE BIOGEL PI INDICATOR 8.5 (GLOVE) ×4
GLOVE SURG ORTHO 8.0 STRL STRW (GLOVE) ×6 IMPLANT
GLOVE SURG SS PI 8.0 STRL IVOR (GLOVE) ×2 IMPLANT
GOWN STRL REUS W/ TWL LRG LVL3 (GOWN DISPOSABLE) ×1 IMPLANT
GOWN STRL REUS W/TWL 2XL LVL3 (GOWN DISPOSABLE) ×2 IMPLANT
GOWN STRL REUS W/TWL LRG LVL3 (GOWN DISPOSABLE) ×3
GOWN STRL REUS W/TWL XL LVL3 (GOWN DISPOSABLE) ×4 IMPLANT
KIT ROOM TURNOVER OR (KITS) ×3 IMPLANT
MANIFOLD NEPTUNE II (INSTRUMENTS) ×3 IMPLANT
PACK ARTHROSCOPY DSU (CUSTOM PROCEDURE TRAY) ×3 IMPLANT
PAD ARMBOARD 7.5X6 YLW CONV (MISCELLANEOUS) ×6 IMPLANT
PADDING CAST COTTON 6X4 STRL (CAST SUPPLIES) ×2 IMPLANT
PENCIL BUTTON HOLSTER BLD 10FT (ELECTRODE) IMPLANT
SET ARTHROSCOPY TUBING (MISCELLANEOUS) ×3
SET ARTHROSCOPY TUBING LN (MISCELLANEOUS) ×1 IMPLANT
SPONGE GAUZE 4X4 12PLY STER LF (GAUZE/BANDAGES/DRESSINGS) ×2 IMPLANT
SPONGE LAP 4X18 X RAY DECT (DISPOSABLE) ×3 IMPLANT
SUT ETHILON 4 0 PS 2 18 (SUTURE) ×3 IMPLANT
SYR 30ML LL (SYRINGE) ×6 IMPLANT
TOWEL OR 17X24 6PK STRL BLUE (TOWEL DISPOSABLE) ×3 IMPLANT
TOWEL OR 17X26 10 PK STRL BLUE (TOWEL DISPOSABLE) ×3 IMPLANT
WAND HAND CNTRL MULTIVAC 90 (MISCELLANEOUS) IMPLANT
WATER STERILE IRR 1000ML POUR (IV SOLUTION) ×3 IMPLANT

## 2014-03-02 NOTE — Discharge Instructions (Signed)
Arthroscopy Arthroscopy is a procedure in which a caregiver uses an arthroscope. An arthroscope is an instrument that allows your caregiver to look directly into a joint. It is like a small telescope attached to a video camera and is similar in size to a pencil. Arthroscopes let your caregiver see inside your joint on an attached television monitor. Most joints in the human body can be examined and surgery can be performed through the arthroscope using small incisions. Prior to the use of arthroscopes, surgeries were done with larger open incisions, which require longer recovery times. On occasion, arthroscopic procedures result in complications such as bleeding, swelling, and pain. If a complication results, a longer recovery and rehabilitation may be required. INDICATIONS Arthroscopic procedures were developed to remove, repair, or replace (reconstruct) damaged tissue. Arthroscopy can be performed if the procedure involves trimming tissue, removing fragments of cartilage or bone (loose bodies) within joints, suctioning debris, biopsy of tissue, smoothing rough surfaces, removing inflamed tissue, shrinking tissue, or sewing (suturing), tacking, or stapling cartilage and ligaments. What can be done is dependent on many factors. Arthroscopy allows for surgeons to perform certain surgical procedures. Most of the surgeries you can go home the same day as the procedure (outpatient procedures) because the procedure does not cause as much trauma to the patient. Arthroscopy is a valuable diagnostic tool. Radiographs (such as X-ray and CT scans) have poor ability at showing soft tissue, whereas arthroscopy gives the caregiver direct visualization of soft tissue, cartilage, and bone. However, the emergence of magnetic resonance imaging (MRI) has lessened the need for arthroscopy as a diagnostic tool.  TECHNIQUE  Repair and reconstruction arthroscopic techniques may require additional and/or larger incisions than  diagnostic arthroscopy portals (-inch incisions). The procedures are often more extensive in repair and reconstruction than excision procedures. Therefore, the patient may need to stay in the hospital overnight after arthroscopic repair or reconstruction. These procedures also disrupt more tissue and discomfort may occur, so the temporary use of braces, casts, or crutches, as well as rehabilitation, may be needed.  In order to undergo an arthroscopic procedure, a complete evaluation is necessary in order to provide the caregiver with as accurate a diagnosis as possible. Sometimes it is necessary to perform diagnostic arthroscopy before another surgery can be scheduled.  Both diagnostic and surgical arthroscopy can be performed under local anesthesia (only the joint is numbed), regional anesthesia (the operative limb is numbed), spinal or epidural anesthesia (only the lower extremities are numbed), or general anesthesia (you are completely asleep). The type of anesthetic is dependent on the patient, the surgeon, and the procedure being performed.  If you ask prior to the operation, you may be able to obtain pictures or a video from the arthroscopic camera.  Do not eat or drink anything for at least 8 hours before surgery. Food and drinks (including coffee) make general anesthesia more hazardous. SEEK MEDICAL CARE IF:  You experience pain, numbness, or coldness in the extremity operated on.  Blue, gray, or dark color appears in the fingers or toenails.  You have increased pain, swelling, redness, drainage, or bleeding in the surgical area despite rest, ice, elevation, and pain medications.  You have signs of infection, including a fever 102F (38.9C) or higher. Document Released: 12/07/2004 Document Revised: 09/22/2013 Document Reviewed: 08/20/2008 [facility] Patient Information 2015 [place]Care, [place]. This information is not intended to replace advice given to you by your health care provider. Make  sure you discuss any questions you have with your health care provider.  What to eat: ° °For your first meals, you should eat lightly; only small meals initially.  If you do not have nausea, you may eat larger meals.  Avoid spicy, greasy and heavy food.   ° °General Anesthesia, Adult, Care After  °Refer to this sheet in the next few weeks. These instructions provide you with information on caring for yourself after your procedure. Your health care provider may also give you more specific instructions. Your treatment has been planned according to current medical practices, but problems sometimes occur. Call your health care provider if you have any problems or questions after your procedure.  °WHAT TO EXPECT AFTER THE PROCEDURE  °After the procedure, it is typical to experience:  °Sleepiness.  °Nausea and vomiting. °HOME CARE INSTRUCTIONS  °For the first 24 hours after general anesthesia:  °Have a responsible person with you.  °Do not drive a car. If you are alone, do not take public transportation.  °Do not drink alcohol.  °Do not take medicine that has not been prescribed by your health care provider.  °Do not sign important papers or make important decisions.  °You may resume a normal diet and activities as directed by your health care provider.  °Change bandages (dressings) as directed.  °If you have questions or problems that seem related to general anesthesia, call the hospital and ask for the anesthetist or anesthesiologist on call. °SEEK MEDICAL CARE IF:  °You have nausea and vomiting that continue the day after anesthesia.  °You develop a rash. °SEEK IMMEDIATE MEDICAL CARE IF:  °You have difficulty breathing.  °You have chest pain.  °You have any allergic problems. °Document Released: 08/14/2000 Document Revised: 01/08/2013 Document Reviewed: 11/21/2012  °ExitCare® Patient Information ©2014 [employer] LLC.  ° ° °

## 2014-03-02 NOTE — Transfer of Care (Signed)
Immediate Anesthesia Transfer of Care Note  Patient: Peter Zimmerman  Procedure(s) Performed: Procedure(s): RIGHT KNEE ARTHROSCOPY (Right)  Patient Location: PACU  Anesthesia Type:General  Level of Consciousness: awake and alert   Airway & Oxygen Therapy: Patient Spontanous Breathing  Post-op Assessment: Report given to PACU RN and Post -op Vital signs reviewed and stable  Post vital signs: Reviewed and stable  Complications: No apparent anesthesia complications

## 2014-03-02 NOTE — Anesthesia Preprocedure Evaluation (Signed)
Anesthesia Evaluation  Patient identified by MRN, date of birth, ID band Patient awake    Reviewed: Allergy & Precautions, H&P , NPO status   History of Anesthesia Complications (+) history of anesthetic complications  Airway Mallampati: II TM Distance: >3 FB Neck ROM: Full    Dental  (+) Teeth Intact,    Pulmonary Current Smoker,  breath sounds clear to auscultation        Cardiovascular hypertension, Pt. on medications - angina- Past MI and - CHF - dysrhythmias Rhythm:Regular     Neuro/Psych  Headaches, PSYCHIATRIC DISORDERS Anxiety Depression  Neuromuscular disease    GI/Hepatic GERD-  Medicated and Controlled,  Endo/Other  diabetes, Type 2, Oral Hypoglycemic Agents  Renal/GU      Musculoskeletal  (+) Fibromyalgia -  Abdominal   Peds  Hematology   Anesthesia Other Findings   Reproductive/Obstetrics                           Anesthesia Physical Anesthesia Plan  ASA: II  Anesthesia Plan: General and Regional   Post-op Pain Management:    Induction: Intravenous  Airway Management Planned: LMA  Additional Equipment: None  Intra-op Plan:   Post-operative Plan: Extubation in OR  Informed Consent: I have reviewed the patients History and Physical, chart, labs and discussed the procedure including the risks, benefits and alternatives for the proposed anesthesia with the patient or authorized representative who has indicated his/her understanding and acceptance.   Dental advisory given  Plan Discussed with: CRNA and Surgeon  Anesthesia Plan Comments:         Anesthesia Quick Evaluation

## 2014-03-02 NOTE — H&P (Signed)
  Peter RamaMichael T Fabel MRN:  [id number] DOB/SEX:  July 02, 1965/male  CHIEF COMPLAINT:  Painful right Knee  HISTORY: Patient is a 48 y.o. male presented with a history of pain in the right knee. Onset of symptoms was abrupt starting several weeks ago with gradually worsening course since that time. Prior procedures on the knee include none. Patient has been treated conservatively with over-the-counter NSAIDs and activity modification. Patient currently rates pain in the knee at 8 out of 10 with activity. There is no pain at night.  PAST MEDICAL HISTORY: Patient Active Problem List   Diagnosis Date Noted  . Headache 03/15/2011  . Migraine 03/15/2011  . CORNS AND CALLOSITIES 07/04/2010  . SKIN LESION 07/04/2010  . FIBROMYALGIA 07/04/2010  . FATIGUE 07/04/2010  . DIABETES MELLITUS, TYPE II 03/12/2007  . HYPERLIPIDEMIA 03/12/2007  . ANXIETY 03/12/2007  . DEPRESSION 03/12/2007  . HYPERTENSION 03/12/2007  . GERD 03/12/2007  . SYMPTOM, INSOMNIA NOS 03/12/2007   Past Medical History  Diagnosis Date  . Hypertension   . Diabetes mellitus   . Anxiety   . Depression   . GERD (gastroesophageal reflux disease)   . Hyperlipidemia   . Chronic pain syndrome   . Suicidal ideation     history off  . Migraine 2005   Past Surgical History  Procedure Laterality Date  . Right tibial surgery      x 3     MEDICATIONS:   No prescriptions prior to admission    ALLERGIES:   Allergies  Allergen Reactions  . Methadone Hcl Other (See Comments)    REACTION: ? Overdose    REVIEW OF SYSTEMS:  A comprehensive review of systems was negative.   FAMILY HISTORY:   Family History  Problem Relation Age of Onset  . Hypertension Other     SOCIAL HISTORY:   History  Substance Use Topics  . Smoking status: Current Every Day Smoker  . Smokeless tobacco: Not on file  . Alcohol Use: No     EXAMINATION:  Vital signs in last 24 hours:    General appearance: alert, cooperative and no  distress Lungs: clear to auscultation bilaterally Heart: regular rate and rhythm, S1, S2 normal, no murmur, click, rub or gallop Abdomen: soft, non-tender; bowel sounds normal; no masses,  no organomegaly Extremities: extremities normal, atraumatic, no cyanosis or edema and Homans sign is negative, no sign of DVT Pulses: 2+ and symmetric Skin: Skin color, texture, turgor normal. No rashes or lesions Neurologic: Alert and oriented X 3, normal strength and tone. Normal symmetric reflexes. Normal coordination and gait  Musculoskeletal:  ROM 0-125, Ligaments intact,  Imaging Review Plain radiographs demonstrate mild degenerative joint disease of the right knee. The overall alignment is neutral. The bone quality appears to be good for age and reported activity level.  Assessment/Plan: Right meniscal tear  Right knee arthroscopy  Daniela Hernan 03/02/2014, 7:00 AM

## 2014-03-02 NOTE — Progress Notes (Signed)
Orthopedic Tech Progress Note Patient Details:  Peter Zimmerman 30-Sep-1965 [id number]  Ortho Devices Type of Ortho Device: Crutches Ortho Device/Splint Interventions: Ordered;Adjustment   Jennye MoccasinHughes, [name] 03/02/2014, 4:24 PM

## 2014-03-02 NOTE — Anesthesia Procedure Notes (Signed)
Anesthesia Regional Block:  Adductor canal block  Pre-Anesthetic Checklist: ,, timeout performed, Correct Patient, Correct Site, Correct Laterality, Correct Procedure, Correct Position, site marked, Risks and benefits discussed,  Surgical consent,  Pre-op evaluation,  At surgeon's request and post-op pain management  Laterality: Lower and Right  Prep: chloraprep       Needles:  Injection technique: Single-shot  Needle Type: Echogenic Needle          Additional Needles:  Procedures: ultrasound guided (picture in chart) Adductor canal block Narrative:  Start time: 03/02/2014 2:22 PM End time: 03/02/2014 2:26 PM Injection made incrementally with aspirations every 5 mL.  Performed by: Personally  Anesthesiologist: Naseer Hearn  Additional Notes: H+P and labs reviewed, risks and benefits discussed with patient, procedure tolerated well without complications

## 2014-03-02 NOTE — Anesthesia Postprocedure Evaluation (Signed)
Anesthesia Post Note  Patient: Peter Zimmerman  Procedure(s) Performed: Procedure(s) (LRB): RIGHT KNEE ARTHROSCOPY (Right)  Anesthesia type: general  Patient location: PACU  Post pain: Pain level controlled  Post assessment: Patient's Cardiovascular Status Stable  Last Vitals:  Filed Vitals:   03/02/14 1616  BP: 128/58  Pulse: 72  Temp: 36.4 C  Resp: 15    Post vital signs: Reviewed and stable  Level of consciousness: sedated  Complications: No apparent anesthesia complications

## 2014-03-03 NOTE — Op Note (Signed)
Dictation Number:  [id number]

## 2014-03-03 NOTE — Op Note (Signed)
[name]:  Peter Zimmerman, Mak            ACCOUNT NO.:  [id number]  MEDICAL RECORD NO.:  [id number]  LOCATION:  [facility]                         FACILITY:  [facility]  PHYSICIAN:  Mila HomerStephen D. Sherlean FootLucey, M.D. DATE OF BIRTH:  1966-03-10  DATE OF PROCEDURE:  03/02/2014 DATE OF DISCHARGE:  03/02/2014                              OPERATIVE REPORT   SURGEON:  Mila HomerStephen D. Sherlean FootLucey, M.D.  ASSISTANT:  Altamese CabalMaurice Jones, PA-C.  ANESTHESIA:  General.  PREOPERATIVE DIAGNOSES: 1. Right knee posttraumatic arthritis. 2. Meniscal tear.  POSTOPERATIVE DIAGNOSES: 1. Right knee posttraumatic arthritis. 2. Meniscal tear.  PROCEDURE:  Right knee arthroscopy with partial meniscectomy and partial lateral meniscectomy.  INDICATION FOR PROCEDURE:  The patient is a 48 year old, status post trauma to the knee with head tilt plateau fracture and open reduction, internal fixation, now with mechanical symptoms.  Informed consent was obtained.  DESCRIPTION OF PROCEDURE:  The patient was laid supine, administered general anesthesia.  Right leg was prepped and draped in usual fashion. Inferolateral and inferomedial portals were created with 11 blade, blunt trocar, and cannulae.  There was minimal arthritis in the patellofemoral joint, a small medial plica which was debrided.  I went into medial compartment, it definitely had a scar tissue as well as a medial meniscus tear, this was all debrided with straight basket forceps and great White shaver.  The ACL and PCL appeared normal.  Went into the figure 4 position and there was lateral meniscus tearing as well.  I debrided the lateral meniscus with straight basket forceps and great White shaver.  I then lavaged, closed 4-0 nylon sutures and dressed with Xeroform, dressing sponges, sterile Webril, and Ace wrap.  COMPLICATIONS:  None.  DRAINS:  None.          ______________________________ Mila HomerStephen D. Sherlean FootLucey, M.D.     SDL/MEDQ  D:  03/03/2014  T:  03/03/2014  Job:  [id number]

## 2014-03-04 ENCOUNTER — Encounter (HOSPITAL_COMMUNITY): Payer: Self-pay | Admitting: Orthopedic Surgery

## 2015-02-25 ENCOUNTER — Inpatient Hospital Stay (HOSPITAL_COMMUNITY): Payer: Commercial Managed Care - HMO

## 2015-02-25 ENCOUNTER — Encounter (HOSPITAL_COMMUNITY)
Admission: AD | Disposition: A | Discharge status: Skilled Nursing Facility | Payer: Self-pay | Source: Other Acute Inpatient Hospital | Attending: Internal Medicine

## 2015-02-25 ENCOUNTER — Inpatient Hospital Stay (HOSPITAL_COMMUNITY)
Admission: AD | Admit: 2015-02-25 | Discharge: 2015-02-26 | DRG: 482 | Disposition: A | Discharge status: Skilled Nursing Facility | Payer: Commercial Managed Care - HMO | Source: Other Acute Inpatient Hospital | Attending: Internal Medicine | Admitting: Internal Medicine

## 2015-02-25 ENCOUNTER — Inpatient Hospital Stay (HOSPITAL_COMMUNITY): Payer: Commercial Managed Care - HMO | Admitting: Anesthesiology

## 2015-02-25 ENCOUNTER — Encounter (HOSPITAL_COMMUNITY): Payer: Self-pay | Admitting: *Deleted

## 2015-02-25 DIAGNOSIS — Z8249 Family history of ischemic heart disease and other diseases of the circulatory system: Secondary | ICD-10-CM | POA: Diagnosis not present

## 2015-02-25 DIAGNOSIS — Z885 Allergy status to narcotic agent status: Secondary | ICD-10-CM | POA: Diagnosis not present

## 2015-02-25 DIAGNOSIS — S72142A Displaced intertrochanteric fracture of left femur, initial encounter for closed fracture: Principal | ICD-10-CM

## 2015-02-25 DIAGNOSIS — S72002A Fracture of unspecified part of neck of left femur, initial encounter for closed fracture: Secondary | ICD-10-CM | POA: Diagnosis not present

## 2015-02-25 DIAGNOSIS — Z7984 Long term (current) use of oral hypoglycemic drugs: Secondary | ICD-10-CM | POA: Diagnosis not present

## 2015-02-25 DIAGNOSIS — F17200 Nicotine dependence, unspecified, uncomplicated: Secondary | ICD-10-CM | POA: Diagnosis present

## 2015-02-25 DIAGNOSIS — G894 Chronic pain syndrome: Secondary | ICD-10-CM | POA: Diagnosis present

## 2015-02-25 DIAGNOSIS — S7292XA Unspecified fracture of left femur, initial encounter for closed fracture: Secondary | ICD-10-CM

## 2015-02-25 DIAGNOSIS — I10 Essential (primary) hypertension: Secondary | ICD-10-CM

## 2015-02-25 DIAGNOSIS — K219 Gastro-esophageal reflux disease without esophagitis: Secondary | ICD-10-CM

## 2015-02-25 DIAGNOSIS — E119 Type 2 diabetes mellitus without complications: Secondary | ICD-10-CM

## 2015-02-25 DIAGNOSIS — Z9689 Presence of other specified functional implants: Secondary | ICD-10-CM

## 2015-02-25 DIAGNOSIS — G43909 Migraine, unspecified, not intractable, without status migrainosus: Secondary | ICD-10-CM | POA: Diagnosis present

## 2015-02-25 DIAGNOSIS — F329 Major depressive disorder, single episode, unspecified: Secondary | ICD-10-CM | POA: Diagnosis present

## 2015-02-25 DIAGNOSIS — Z79891 Long term (current) use of opiate analgesic: Secondary | ICD-10-CM

## 2015-02-25 DIAGNOSIS — F411 Generalized anxiety disorder: Secondary | ICD-10-CM | POA: Diagnosis present

## 2015-02-25 DIAGNOSIS — L84 Corns and callosities: Secondary | ICD-10-CM

## 2015-02-25 DIAGNOSIS — W19XXXA Unspecified fall, initial encounter: Secondary | ICD-10-CM | POA: Diagnosis present

## 2015-02-25 DIAGNOSIS — S72009A Fracture of unspecified part of neck of unspecified femur, initial encounter for closed fracture: Secondary | ICD-10-CM | POA: Diagnosis present

## 2015-02-25 DIAGNOSIS — E785 Hyperlipidemia, unspecified: Secondary | ICD-10-CM | POA: Diagnosis present

## 2015-02-25 DIAGNOSIS — G4700 Insomnia, unspecified: Secondary | ICD-10-CM

## 2015-02-25 DIAGNOSIS — F32A Depression, unspecified: Secondary | ICD-10-CM | POA: Diagnosis present

## 2015-02-25 DIAGNOSIS — Z79899 Other long term (current) drug therapy: Secondary | ICD-10-CM | POA: Diagnosis not present

## 2015-02-25 DIAGNOSIS — Z419 Encounter for procedure for purposes other than remedying health state, unspecified: Secondary | ICD-10-CM

## 2015-02-25 DIAGNOSIS — M25552 Pain in left hip: Secondary | ICD-10-CM | POA: Diagnosis present

## 2015-02-25 HISTORY — PX: INTRAMEDULLARY (IM) NAIL INTERTROCHANTERIC: SHX5875

## 2015-02-25 LAB — GLUCOSE, CAPILLARY
GLUCOSE-CAPILLARY: 112 mg/dL — AB (ref 65–99)
Glucose-Capillary: 108 mg/dL — ABNORMAL HIGH (ref 65–99)
Glucose-Capillary: 99 mg/dL (ref 65–99)

## 2015-02-25 LAB — LIPID PANEL
Cholesterol: 129 mg/dL (ref 0–200)
HDL: 35 mg/dL — AB (ref >40)
LDL CALC: 77 mg/dL (ref 0–99)
Total CHOL/HDL Ratio: 3.7 RATIO
Triglycerides: 84 mg/dL (ref <150)
VLDL: 17 mg/dL (ref 0–40)

## 2015-02-25 LAB — BASIC METABOLIC PANEL
Anion gap: 6 (ref 5–15)
BUN: 10 mg/dL (ref 6–20)
CHLORIDE: 103 mmol/L (ref 101–111)
CO2: 23 mmol/L (ref 22–32)
Calcium: 8.2 mg/dL — ABNORMAL LOW (ref 8.9–10.3)
Creatinine, Ser: 0.69 mg/dL (ref 0.61–1.24)
GFR calc Af Amer: >60 mL/min (ref >60)
GFR calc non Af Amer: >60 mL/min (ref >60)
GLUCOSE: 104 mg/dL — AB (ref 65–99)
POTASSIUM: 3.5 mmol/L (ref 3.5–5.1)
Sodium: 132 mmol/L — ABNORMAL LOW (ref 135–145)

## 2015-02-25 LAB — COMPREHENSIVE METABOLIC PANEL
ALBUMIN: 3.4 g/dL — AB (ref 3.5–5.0)
ALT: 12 U/L — AB (ref 17–63)
AST: 16 U/L (ref 15–41)
Alkaline Phosphatase: 51 U/L (ref 38–126)
Anion gap: 6 (ref 5–15)
BUN: 12 mg/dL (ref 6–20)
CHLORIDE: 107 mmol/L (ref 101–111)
CO2: 22 mmol/L (ref 22–32)
CREATININE: 0.67 mg/dL (ref 0.61–1.24)
Calcium: 8.5 mg/dL — ABNORMAL LOW (ref 8.9–10.3)
GFR calc Af Amer: >60 mL/min (ref >60)
GFR calc non Af Amer: >60 mL/min (ref >60)
GLUCOSE: 98 mg/dL (ref 65–99)
POTASSIUM: 3.6 mmol/L (ref 3.5–5.1)
Sodium: 135 mmol/L (ref 135–145)
Total Bilirubin: 0.5 mg/dL (ref 0.3–1.2)
Total Protein: 5.6 g/dL — ABNORMAL LOW (ref 6.5–8.1)

## 2015-02-25 LAB — CBC WITH DIFFERENTIAL/PLATELET
BASOS ABS: 0 10*3/uL (ref 0.0–0.1)
BASOS PCT: 0 %
EOS PCT: 1 %
Eosinophils Absolute: 0.1 10*3/uL (ref 0.0–0.7)
HCT: 34.1 % — ABNORMAL LOW (ref 39.0–52.0)
Hemoglobin: 11.4 g/dL — ABNORMAL LOW (ref 13.0–17.0)
LYMPHS PCT: 25 %
Lymphs Abs: 2.9 10*3/uL (ref 0.7–4.0)
MCH: 30.5 pg (ref 26.0–34.0)
MCHC: 33.4 g/dL (ref 30.0–36.0)
MCV: 91.2 fL (ref 78.0–100.0)
MONO ABS: 0.9 10*3/uL (ref 0.1–1.0)
Monocytes Relative: 8 %
NEUTROS ABS: 8 10*3/uL — AB (ref 1.7–7.7)
Neutrophils Relative %: 66 %
PLATELETS: 234 10*3/uL (ref 150–400)
RBC: 3.74 MIL/uL — AB (ref 4.22–5.81)
RDW: 13.1 % (ref 11.5–15.5)
WBC: 11.9 10*3/uL — AB (ref 4.0–10.5)

## 2015-02-25 LAB — CBC
HEMATOCRIT: 32.9 % — AB (ref 39.0–52.0)
Hemoglobin: 11.1 g/dL — ABNORMAL LOW (ref 13.0–17.0)
MCH: 31 pg (ref 26.0–34.0)
MCHC: 33.7 g/dL (ref 30.0–36.0)
MCV: 91.9 fL (ref 78.0–100.0)
Platelets: 221 10*3/uL (ref 150–400)
RBC: 3.58 MIL/uL — ABNORMAL LOW (ref 4.22–5.81)
RDW: 13.1 % (ref 11.5–15.5)
WBC: 10.8 10*3/uL — AB (ref 4.0–10.5)

## 2015-02-25 LAB — PROTIME-INR
INR: 1.15 (ref 0.00–1.49)
Prothrombin Time: 14.9 seconds (ref 11.6–15.2)

## 2015-02-25 LAB — SURGICAL PCR SCREEN
MRSA, PCR: NEGATIVE
Staphylococcus aureus: NEGATIVE

## 2015-02-25 LAB — TYPE AND SCREEN
ABO/RH(D): O POS
ANTIBODY SCREEN: NEGATIVE

## 2015-02-25 LAB — ABO/RH: ABO/RH(D): O POS

## 2015-02-25 LAB — HIV ANTIBODY (ROUTINE TESTING W REFLEX): HIV Screen 4th Generation wRfx: NONREACTIVE

## 2015-02-25 LAB — APTT: APTT: 27 s (ref 24–37)

## 2015-02-25 SURGERY — FIXATION, FRACTURE, INTERTROCHANTERIC, WITH INTRAMEDULLARY ROD
Anesthesia: Spinal | Site: Hip | Laterality: Left

## 2015-02-25 MED ORDER — PROPOFOL 500 MG/50ML IV EMUL
INTRAVENOUS | Status: DC | PRN
  Administered 2015-02-25: 50 ug/kg/min via INTRAVENOUS

## 2015-02-25 MED ORDER — LORAZEPAM 1 MG PO TABS
1.0000 mg | ORAL_TABLET | Freq: Two times a day (BID) | ORAL | Status: DC
  Administered 2015-02-26: 1 mg via ORAL
  Filled 2015-02-25: qty 1

## 2015-02-25 MED ORDER — HYDROMORPHONE HCL 1 MG/ML IJ SOLN
0.2500 mg | INTRAMUSCULAR | Status: AC | PRN
  Administered 2015-02-25 – 2015-02-26 (×11): 0.5 mg via INTRAVENOUS
  Filled 2015-02-25 (×4): qty 1

## 2015-02-25 MED ORDER — CEFAZOLIN SODIUM-DEXTROSE 2-3 GM-% IV SOLR
2.0000 g | Freq: Four times a day (QID) | INTRAVENOUS | Status: AC
  Administered 2015-02-25 – 2015-02-26 (×2): 2 g via INTRAVENOUS
  Filled 2015-02-25 (×2): qty 50

## 2015-02-25 MED ORDER — TOPIRAMATE 100 MG PO TABS
100.0000 mg | ORAL_TABLET | Freq: Two times a day (BID) | ORAL | Status: DC
  Administered 2015-02-25 – 2015-02-26 (×3): 100 mg via ORAL
  Filled 2015-02-25 (×3): qty 1

## 2015-02-25 MED ORDER — PHENYLEPHRINE HCL 10 MG/ML IJ SOLN
INTRAMUSCULAR | Status: AC
  Filled 2015-02-25: qty 1

## 2015-02-25 MED ORDER — FENTANYL CITRATE (PF) 100 MCG/2ML IJ SOLN
INTRAMUSCULAR | Status: AC
  Filled 2015-02-25: qty 2

## 2015-02-25 MED ORDER — ENSURE ENLIVE PO LIQD
237.0000 mL | Freq: Every day | ORAL | Status: DC
  Administered 2015-02-26: 237 mL via ORAL

## 2015-02-25 MED ORDER — ACETAMINOPHEN 325 MG PO TABS: 650.0000 mg | ORAL_TABLET | Freq: Four times a day (QID) | ORAL | Status: DC | PRN

## 2015-02-25 MED ORDER — HYDROCODONE-ACETAMINOPHEN 5-325 MG PO TABS
1.0000 | ORAL_TABLET | Freq: Four times a day (QID) | ORAL | Status: DC | PRN
  Administered 2015-02-25: 2 via ORAL
  Filled 2015-02-25 (×2): qty 2

## 2015-02-25 MED ORDER — POLYETHYLENE GLYCOL 3350 17 G PO PACK: 17.0000 g | PACK | Freq: Every day | ORAL | Status: DC | PRN

## 2015-02-25 MED ORDER — SODIUM CHLORIDE 0.9 % IV SOLN
INTRAVENOUS | Status: DC
  Administered 2015-02-25 (×2): via INTRAVENOUS

## 2015-02-25 MED ORDER — LORAZEPAM 1 MG PO TABS
1.0000 mg | ORAL_TABLET | Freq: Once | ORAL | Status: AC
  Administered 2015-02-25: 1 mg via ORAL
  Filled 2015-02-25: qty 1

## 2015-02-25 MED ORDER — CEFAZOLIN SODIUM-DEXTROSE 2-3 GM-% IV SOLR
2.0000 g | INTRAVENOUS | Status: AC
  Administered 2015-02-25: 2 g via INTRAVENOUS

## 2015-02-25 MED ORDER — MIDAZOLAM HCL 2 MG/2ML IJ SOLN
INTRAMUSCULAR | Status: AC
  Filled 2015-02-25: qty 2

## 2015-02-25 MED ORDER — 0.9 % SODIUM CHLORIDE (POUR BTL) OPTIME
TOPICAL | Status: DC | PRN
  Administered 2015-02-25: 1000 mL

## 2015-02-25 MED ORDER — PANTOPRAZOLE SODIUM 40 MG PO TBEC
40.0000 mg | DELAYED_RELEASE_TABLET | Freq: Every day | ORAL | Status: DC
  Administered 2015-02-25 – 2015-02-26 (×2): 40 mg via ORAL
  Filled 2015-02-25 (×2): qty 1

## 2015-02-25 MED ORDER — LORAZEPAM 1 MG PO TABS
1.0000 mg | ORAL_TABLET | Freq: Three times a day (TID) | ORAL | Status: DC
  Administered 2015-02-25: 2 mg via ORAL
  Filled 2015-02-25: qty 2

## 2015-02-25 MED ORDER — MORPHINE SULFATE (PF) 2 MG/ML IV SOLN
2.0000 mg | INTRAVENOUS | Status: DC | PRN
  Administered 2015-02-25 – 2015-02-26 (×6): 2 mg via INTRAVENOUS
  Filled 2015-02-25 (×7): qty 1

## 2015-02-25 MED ORDER — METOCLOPRAMIDE HCL 5 MG/ML IJ SOLN: 5.0000 mg | Freq: Three times a day (TID) | INTRAMUSCULAR | Status: DC | PRN

## 2015-02-25 MED ORDER — OXYCODONE-ACETAMINOPHEN 5-325 MG PO TABS
2.0000 | ORAL_TABLET | ORAL | Status: DC | PRN
  Administered 2015-02-25: 2 via ORAL
  Filled 2015-02-25: qty 2

## 2015-02-25 MED ORDER — INSULIN ASPART 100 UNIT/ML ~~LOC~~ SOLN: 0.0000 [IU] | Freq: Three times a day (TID) | SUBCUTANEOUS | Status: DC

## 2015-02-25 MED ORDER — DEXTROSE 5 % IV SOLN
10.0000 mg | INTRAVENOUS | Status: DC | PRN
  Administered 2015-02-25: 50 ug/min via INTRAVENOUS

## 2015-02-25 MED ORDER — DOCUSATE SODIUM 100 MG PO CAPS: 100.0000 mg | ORAL_CAPSULE | Freq: Two times a day (BID) | ORAL | Status: DC

## 2015-02-25 MED ORDER — LACTATED RINGERS IV SOLN
INTRAVENOUS | Status: DC
  Administered 2015-02-25 (×2): via INTRAVENOUS

## 2015-02-25 MED ORDER — HYDROMORPHONE HCL 1 MG/ML IJ SOLN
INTRAMUSCULAR | Status: AC
  Filled 2015-02-25: qty 2

## 2015-02-25 MED ORDER — LIDOCAINE HCL (CARDIAC) 20 MG/ML IV SOLN
INTRAVENOUS | Status: AC
  Filled 2015-02-25: qty 5

## 2015-02-25 MED ORDER — PHENYLEPHRINE 40 MCG/ML (10ML) SYRINGE FOR IV PUSH (FOR BLOOD PRESSURE SUPPORT)
PREFILLED_SYRINGE | INTRAVENOUS | Status: AC
  Filled 2015-02-25: qty 10

## 2015-02-25 MED ORDER — OXYCODONE-ACETAMINOPHEN 5-325 MG PO TABS
2.0000 | ORAL_TABLET | Freq: Four times a day (QID) | ORAL | Status: DC | PRN
  Administered 2015-02-25: 2 via ORAL
  Filled 2015-02-25: qty 2

## 2015-02-25 MED ORDER — FENTANYL CITRATE (PF) 100 MCG/2ML IJ SOLN
100.0000 ug | Freq: Once | INTRAMUSCULAR | Status: AC
  Administered 2015-02-25: 100 ug via INTRAVENOUS

## 2015-02-25 MED ORDER — PHENYLEPHRINE HCL 10 MG/ML IJ SOLN
INTRAMUSCULAR | Status: DC | PRN
  Administered 2015-02-25 (×2): 200 ug via INTRAVENOUS

## 2015-02-25 MED ORDER — LORAZEPAM 1 MG PO TABS
2.0000 mg | ORAL_TABLET | Freq: Every day | ORAL | Status: DC
  Administered 2015-02-25: 2 mg via ORAL
  Filled 2015-02-25: qty 2

## 2015-02-25 MED ORDER — ONDANSETRON HCL 4 MG/2ML IJ SOLN: 4.0000 mg | Freq: Four times a day (QID) | INTRAMUSCULAR | Status: DC | PRN

## 2015-02-25 MED ORDER — SUCRALFATE 1 G PO TABS
1.0000 g | ORAL_TABLET | Freq: Four times a day (QID) | ORAL | Status: DC
  Administered 2015-02-25 – 2015-02-26 (×6): 1 g via ORAL
  Filled 2015-02-25 (×6): qty 1

## 2015-02-25 MED ORDER — FENTANYL CITRATE (PF) 250 MCG/5ML IJ SOLN
INTRAMUSCULAR | Status: AC
  Filled 2015-02-25: qty 5

## 2015-02-25 MED ORDER — PHENOL 1.4 % MT LIQD: 1.0000 | OROMUCOSAL | Status: DC | PRN

## 2015-02-25 MED ORDER — ASPIRIN 325 MG PO TABS: 325.0000 mg | ORAL_TABLET | Freq: Every day | ORAL | Status: DC

## 2015-02-25 MED ORDER — INFLUENZA VAC SPLIT QUAD 0.5 ML IM SUSY
0.5000 mL | PREFILLED_SYRINGE | INTRAMUSCULAR | Status: DC
  Filled 2015-02-25: qty 0.5

## 2015-02-25 MED ORDER — OXYCODONE HCL 5 MG PO TABS
10.0000 mg | ORAL_TABLET | ORAL | Status: DC | PRN
  Administered 2015-02-25 – 2015-02-26 (×6): 15 mg via ORAL
  Filled 2015-02-25 (×6): qty 3

## 2015-02-25 MED ORDER — ACETAMINOPHEN 500 MG PO TABS: 1000.0000 mg | ORAL_TABLET | Freq: Once | ORAL | Status: DC

## 2015-02-25 MED ORDER — DOCUSATE SODIUM 100 MG PO CAPS
100.0000 mg | ORAL_CAPSULE | Freq: Two times a day (BID) | ORAL | Status: DC
  Administered 2015-02-25 – 2015-02-26 (×2): 100 mg via ORAL
  Filled 2015-02-25 (×2): qty 1

## 2015-02-25 MED ORDER — PROPOFOL 10 MG/ML IV BOLUS
INTRAVENOUS | Status: AC
  Filled 2015-02-25: qty 20

## 2015-02-25 MED ORDER — CEFAZOLIN SODIUM-DEXTROSE 2-3 GM-% IV SOLR
INTRAVENOUS | Status: AC
Start: 2015-02-25 — End: 2015-02-26
  Filled 2015-02-25: qty 50

## 2015-02-25 MED ORDER — CHLORHEXIDINE GLUCONATE 4 % EX LIQD
60.0000 mL | Freq: Once | CUTANEOUS | Status: DC
  Filled 2015-02-25: qty 60

## 2015-02-25 MED ORDER — FENTANYL CITRATE (PF) 100 MCG/2ML IJ SOLN
INTRAMUSCULAR | Status: DC | PRN
  Administered 2015-02-25 (×2): 50 ug via INTRAVENOUS

## 2015-02-25 MED ORDER — ALUM & MAG HYDROXIDE-SIMETH 200-200-20 MG/5ML PO SUSP: 30.0000 mL | Freq: Four times a day (QID) | ORAL | Status: DC | PRN

## 2015-02-25 MED ORDER — NICOTINE 21 MG/24HR TD PT24
21.0000 mg | MEDICATED_PATCH | Freq: Every day | TRANSDERMAL | Status: DC
  Administered 2015-02-25 – 2015-02-26 (×2): 21 mg via TRANSDERMAL
  Filled 2015-02-25 (×2): qty 1

## 2015-02-25 MED ORDER — ROPIVACAINE HCL 5 MG/ML IJ SOLN
INTRAMUSCULAR | Status: DC | PRN
  Administered 2015-02-25: 15 mg via PERINEURAL

## 2015-02-25 MED ORDER — MIDAZOLAM HCL 5 MG/5ML IJ SOLN
INTRAMUSCULAR | Status: DC | PRN
  Administered 2015-02-25: 2 mg via INTRAVENOUS

## 2015-02-25 MED ORDER — ACETAMINOPHEN 650 MG RE SUPP: 650.0000 mg | Freq: Four times a day (QID) | RECTAL | Status: DC | PRN

## 2015-02-25 MED ORDER — LISINOPRIL 10 MG PO TABS
10.0000 mg | ORAL_TABLET | Freq: Every day | ORAL | Status: DC
  Administered 2015-02-25 – 2015-02-26 (×2): 10 mg via ORAL
  Filled 2015-02-25 (×2): qty 1

## 2015-02-25 MED ORDER — PROMETHAZINE HCL 25 MG/ML IJ SOLN
6.2500 mg | INTRAMUSCULAR | Status: DC | PRN
  Filled 2015-02-25: qty 1

## 2015-02-25 MED ORDER — MENTHOL 3 MG MT LOZG: 1.0000 | LOZENGE | OROMUCOSAL | Status: DC | PRN

## 2015-02-25 MED ORDER — ASPIRIN EC 325 MG PO TBEC
325.0000 mg | DELAYED_RELEASE_TABLET | Freq: Every day | ORAL | Status: DC
  Administered 2015-02-26: 325 mg via ORAL
  Filled 2015-02-25: qty 1

## 2015-02-25 MED ORDER — METOCLOPRAMIDE HCL 5 MG PO TABS: 5.0000 mg | ORAL_TABLET | Freq: Three times a day (TID) | ORAL | Status: DC | PRN

## 2015-02-25 MED ORDER — ONDANSETRON HCL 4 MG PO TABS: 4.0000 mg | ORAL_TABLET | Freq: Four times a day (QID) | ORAL | Status: DC | PRN

## 2015-02-25 SURGICAL SUPPLY — 40 items
BIT DRILL AO GAMMA 4.2X180 (BIT) ×2 IMPLANT
BNDG COHESIVE 4X5 TAN STRL (GAUZE/BANDAGES/DRESSINGS) ×3 IMPLANT
BNDG GAUZE ELAST 4 BULKY (GAUZE/BANDAGES/DRESSINGS) ×3 IMPLANT
CLOSURE STERI-STRIP 1/2X4 (GAUZE/BANDAGES/DRESSINGS) ×3
CLSR STERI-STRIP ANTIMIC 1/2X4 (GAUZE/BANDAGES/DRESSINGS) ×3 IMPLANT
COVER PERINEAL POST (MISCELLANEOUS) ×3 IMPLANT
COVER SURGICAL LIGHT HANDLE (MISCELLANEOUS) ×3 IMPLANT
DRAPE STERI IOBAN 125X83 (DRAPES) ×3 IMPLANT
DRSG MEPILEX BORDER 4X4 (GAUZE/BANDAGES/DRESSINGS) ×8 IMPLANT
DURAPREP 26ML APPLICATOR (WOUND CARE) ×3 IMPLANT
ELECT REM PT RETURN 9FT ADLT (ELECTROSURGICAL) ×3
ELECTRODE REM PT RTRN 9FT ADLT (ELECTROSURGICAL) ×1 IMPLANT
GLOVE BIO SURGEON STRL SZ7 (GLOVE) ×6 IMPLANT
GLOVE BIO SURGEON STRL SZ7.5 (GLOVE) ×3 IMPLANT
GLOVE BIOGEL PI IND STRL 7.0 (GLOVE) ×2 IMPLANT
GLOVE BIOGEL PI IND STRL 8 (GLOVE) ×1 IMPLANT
GLOVE BIOGEL PI INDICATOR 7.0 (GLOVE) ×4
GLOVE BIOGEL PI INDICATOR 8 (GLOVE) ×2
GOWN STRL REUS W/ TWL LRG LVL3 (GOWN DISPOSABLE) ×2 IMPLANT
GOWN STRL REUS W/TWL LRG LVL3 (GOWN DISPOSABLE) ×6
GUIDEROD T2 3X1000 (ROD) ×2 IMPLANT
K-WIRE  3.2X450M STR (WIRE) ×2
K-WIRE 3.2X450M STR (WIRE) ×1
KIT NAIL LONG 10X140X125 (Nail) ×2 IMPLANT
KIT ROOM TURNOVER OR (KITS) ×3 IMPLANT
KWIRE 3.2X450M STR (WIRE) IMPLANT
MANIFOLD NEPTUNE II (INSTRUMENTS) ×3 IMPLANT
NS IRRIG 1000ML POUR BTL (IV SOLUTION) ×3 IMPLANT
PACK GENERAL/GYN (CUSTOM PROCEDURE TRAY) ×3 IMPLANT
PAD ARMBOARD 7.5X6 YLW CONV (MISCELLANEOUS) ×3 IMPLANT
PAD CAST 4YDX4 CTTN HI CHSV (CAST SUPPLIES) ×1 IMPLANT
PADDING CAST COTTON 4X4 STRL (CAST SUPPLIES) ×3
SCREW LAG GAMMA 3 TI 10.5X100M (Screw) ×2 IMPLANT
SCREW LOCKING THREADED 5X47.5 (Screw) ×2 IMPLANT
SUT MNCRL AB 4-0 PS2 18 (SUTURE) IMPLANT
SUT MON AB 2-0 CT1 27 (SUTURE) ×3 IMPLANT
SUT VIC AB 0 CT1 27 (SUTURE) ×3
SUT VIC AB 0 CT1 27XBRD ANBCTR (SUTURE) ×1 IMPLANT
TOWEL OR 17X24 6PK STRL BLUE (TOWEL DISPOSABLE) ×3 IMPLANT
TOWEL OR 17X26 10 PK STRL BLUE (TOWEL DISPOSABLE) ×3 IMPLANT

## 2015-02-25 NOTE — Op Note (Signed)
DATE OF SURGERY:  02/25/2015  TIME: 4:07 PM  PATIENT NAME:  [name]  AGE: 49 y.o.  PRE-OPERATIVE DIAGNOSIS:  left intertrochanteric femur fracture  POST-OPERATIVE DIAGNOSIS:  SAME  PROCEDURE:  INTRAMEDULLARY (IM) NAIL INTERTROCHANTRIC  SURGEON:  Raliegh Scobie D  ASSISTANT:  Janalee Dane, PA-C, She was present and scrubbed throughout the case, critical for completion in a timely fashion, and for retraction, instrumentation, and closure.   OPERATIVE IMPLANTS: [employer]ryker Gamma Nail with distal interlock screw  PREOPERATIVE INDICATIONS:  Peter Zimmerman is a 49 y.o. year old who fell and suffered a hip fracture. He was brought into the ER and then admitted and optimized and then elected for surgical intervention.    The risks benefits and alternatives were discussed with the patient including but not limited to the risks of nonoperative treatment, versus surgical intervention including infection, bleeding, nerve injury, malunion, nonunion, hardware prominence, hardware failure, need for hardware removal, blood clots, cardiopulmonary complications, morbidity, mortality, among others, and they were willing to proceed.    OPERATIVE PROCEDURE:  The patient was brought to the operating room and placed in the supine position. General anesthesia was administered, with a foley. He was placed on the fracture table.  Closed reduction was performed under C-arm guidance. The length of the femur was also measured using fluoroscopy. Time out was then performed after sterile prep and drape. He received preoperative antibiotics.  Incision was made proximal to the greater trochanter. A guidewire was placed in the appropriate position. Confirmation was made on AP and lateral views. The above-named nail was opened. I opened the proximal femur with a reamer. I then placed the nail by hand easily down. I did not need to ream the femur.  Once the nail was completely seated, I placed a guidepin  into the femoral head into the center center position. I measured the length, and then reamed the lateral cortex and up into the head. I then placed the lag screw. Slight compression was applied. Anatomic fixation achieved. Bone quality was mediocre.  I then secured the proximal interlocking bolt, and took off a half a turn, and then removed the instruments, and took final C-arm pictures AP and lateral the entire length of the leg.  I then used perfect circles technique to place a distal interlock screw.   Anatomic reconstruction was achieved, and the wounds were irrigated copiously and closed with Vicryl followed by staples and sterile gauze for the skin. The patient was awakened and returned to PACU in stable and satisfactory condition. There no complications and the patient tolerated the procedure well.  He will be weightbearing as tolerated, and will be on asa 325  for a period of four weeks after discharge.   Margarita Rana, M.D.    This note was generated using a template and dragon dictation system. In light of that, I have reviewed the note and all aspects of it are applicable to this case. Any dictation errors are due to the computerized dictation system.

## 2015-02-25 NOTE — Progress Notes (Signed)
Patient admitted after midnight- please see H&P.  For surgery today.  Pain management will be difficult as patient has been on chronic pain meds for a while  Peter Zimmerman

## 2015-02-25 NOTE — Anesthesia Procedure Notes (Signed)
Spinal Patient location during procedure: OR Start time: 02/25/2015 2:56 PM End time: 02/25/2015 3:06 PM Staffing Anesthesiologist: Heather Roberts Performed by: anesthesiologist  Preanesthetic Checklist Completed: patient identified, surgical consent, pre-op evaluation, timeout performed, IV checked, risks and benefits discussed and monitors and equipment checked Spinal Block Patient position: right lateral decubitus Prep: DuraPrep Patient monitoring: cardiac monitor, continuous pulse ox and blood pressure Approach: midline Location: L2-3 Injection technique: single-shot Needle Needle type: Pencan  Needle gauge: 24 G Needle length: 9 cm Additional Notes Functioning IV was confirmed and monitors were applied. Sterile prep and drape, including hand hygiene and sterile gloves were used. The patient was positioned and the spine was prepped. The skin was anesthetized with lidocaine.  Free flow of clear CSF was obtained prior to injecting local anesthetic into the CSF.  The spinal needle aspirated freely following injection.  The needle was carefully withdrawn.  The patient tolerated the procedure well.

## 2015-02-25 NOTE — Progress Notes (Signed)
PT Cancellation Note  Patient Details Name: Peter Zimmerman MRN: [id number] DOB: January 25, 1966   Cancelled Treatment:    Reason Eval/Treat Not Completed: Patient not medically ready Plan for OR in AM. Will follow up post surgery per new orders.   Blake Divine A Kaisey Huseby 02/25/2015, 7:34 AM Mylo Red, PT, DPT [phone]

## 2015-02-25 NOTE — Consult Note (Signed)
ORTHOPAEDIC CONSULTATION  REQUESTING PHYSICIAN: Geradine Girt, DO  Chief Complaint: L hip pain  HPI: Peter Zimmerman is a 49 y.o. male who complains of L hip pain after a mechanical fall at home.  Per the patient's report he fell off 2 steps on the front porch of his home.  He was unable to weight bear without significant discomfort after the fall.  He denies hitting his head or LOC.  He has a history of R leg injury back in 2000 in which he has had multiple surgeries.  He is managed by a chronic pain clinic for his R leg pain as well as a spinal cord stimulator.  He was ambulatory with a walker or cane prior to the fall.  In the ED, imaging revealed a comminuted R intertrochanteric femur fracture. He denies any numbness or tingling down the L leg.   Past Medical History  Diagnosis Date  . Hypertension   . Diabetes mellitus   . Anxiety   . Depression   . GERD (gastroesophageal reflux disease)   . Hyperlipidemia   . Chronic pain syndrome   . Suicidal ideation     history off  . Migraine 2005   Past Surgical History  Procedure Laterality Date  . Right tibial surgery      x 3  . Knee arthroscopy Right 03/02/2014    Procedure: RIGHT KNEE ARTHROSCOPY;  Surgeon: Vickey Huger, MD;  Location: [place];  Service: Orthopedics;  Laterality: Right;   Social History   Social History  . Marital Status: Married    Spouse Name: N/A  . Number of Children: N/A  . Years of Education: N/A   Social History Main Topics  . Smoking status: Current Every Day Smoker  . Smokeless tobacco: None  . Alcohol Use: No  . Drug Use: No  . Sexual Activity: Yes   Other Topics Concern  . None   Social History Narrative   Family History  Problem Relation Age of Onset  . Hypertension Other    Allergies  Allergen Reactions  . Methadone Hcl Other (See Comments)    REACTION: ? Overdose   Prior to Admission medications   Medication Sig Start Date End Date Taking? Authorizing Provider    Diclofenac Potassium (ZIPSOR) 25 MG CAPS Take 25 mg by mouth 2 (two) times a week.   Yes Historical Provider, MD  lidocaine (LIDODERM) 5 % Place 1 patch onto the skin daily as needed (for pain). Remove & Discard patch within 12 hours or as directed by MD   Yes Historical Provider, MD  lisinopril (PRINIVIL,ZESTRIL) 10 MG tablet Take 10 mg by mouth daily.   Yes Historical Provider, MD  LORazepam (ATIVAN) 2 MG tablet Take 1-2 mg by mouth 3 (three) times daily.    Yes Historical Provider, MD  metFORMIN (GLUCOPHAGE) 500 MG tablet Take 500 mg by mouth daily as needed (for sugar).    Yes Historical Provider, MD  Multiple Vitamins-Minerals (MULTIVITAMIN WITH MINERALS) tablet Take 1 tablet by mouth daily.   Yes Historical Provider, MD  omeprazole (PRILOSEC) 40 MG capsule Take 40 mg by mouth daily.    Yes Historical Provider, MD  oxyCODONE-acetaminophen (PERCOCET) 10-325 MG per tablet Take 1 tablet by mouth 5 (five) times daily.    Yes Historical Provider, MD  topiramate (TOPAMAX) 100 MG tablet Take 100 mg by mouth 2 (two) times daily.   Yes Historical Provider, MD  Naloxone HCl 0.4 MG/0.4ML SOAJ Inject 0.4 mg  as directed once. Incase of overdose    Historical Provider, MD   Dg Pelvis Portable  02/25/2015   CLINICAL DATA:  49 year old male with hip pain after a fall. Transferred from [facility] with intertrochanteric femur fracture. Initial encounter.  EXAM: PORTABLE PELVIS 1-2 VIEWS  COMPARISON:  [facility] left hip series 10516.  FINDINGS: Portable AP view of the pelvis at 0845 hrs. Comminuted proximal left femur intertrochanteric fracture appears stable in configuration since yesterday. Pelvis appears intact. Spinal stimulator type generator device projects over the left lumbosacral junction. Proximal right femur appears grossly intact.  IMPRESSION: Stable comminuted left femur intertrochanteric fracture since yesterday.   Electronically Signed   By: Genevie Ann M.D.   On: 02/25/2015 09:11     Positive ROS: All other systems have been reviewed and were otherwise negative with the exception of those mentioned in the HPI and as above.  Labs cbc  Recent Labs  02/25/15 0147 02/25/15 0603  WBC 11.9* 10.8*  HGB 11.4* 11.1*  HCT 34.1* 32.9*  PLT 234 221    Labs inflam No results for input(s): CRP in the last 72 hours.  Invalid input(s): ESR  Labs coag  Recent Labs  02/25/15 0147  INR 1.15     Recent Labs  02/25/15 0147 02/25/15 0603  NA 135 132*  K 3.6 3.5  CL 107 103  CO2 22 23  GLUCOSE 98 104*  BUN 12 10  CREATININE 0.67 0.69  CALCIUM 8.5* 8.2*    Physical Exam: Filed Vitals:   02/25/15 1043  BP: 100/59  Pulse:   Temp:   Resp:    General: Alert, no acute distress Cardiovascular: No pedal edema Respiratory: No cyanosis, no use of accessory musculature GI: No organomegaly, abdomen is soft and non-tender Skin: No lesions in the area of chief complaint other than those listed below in MSK exam.  Neurologic: Sensation intact distally Psychiatric: Patient is competent for consent with normal mood and affect Lymphatic: No axillary or cervical lymphadenopathy  MUSCULOSKELETAL:  L hip has not swelling or erythema.  Pain with ROM and log roll.  Sensation is intact with 2+ distal pulses.  Other extremities are atraumatic with painless ROM and NVI.  Assessment: L intertrochanteric femur fracture after mechanical fall at home  Plan: Plan to take to the OR today for ORIF of the L hip to maximize mobilization given limitations of R leg from previous injury.  Will remain on bedrest and NPO until surgery.     Gae Dry, PA-C Cell [phone]   02/25/2015 11:54 AM

## 2015-02-25 NOTE — Progress Notes (Signed)
The patient has a comminuted intertrochanteric hip fracture on the left side.  Pt will be scheduled for surgery 10/6 by Dr. Renaye Rakers who will see the patient in the AM on 10/6.  Pt will need medical admission and clearance and he will take on his care.

## 2015-02-25 NOTE — Interval H&P Note (Signed)
History and Physical Interval Note:  02/25/2015 2:36 PM  Peter Zimmerman  has presented today for surgery, with the diagnosis of left intertroch femur fracture  The various methods of treatment have been discussed with the patient and family. After consideration of risks, benefits and other options for treatment, the patient has consented to  Procedure(s): INTRAMEDULLARY (IM) NAIL INTERTROCHANTRIC (Left) as a surgical intervention .  The patient's history has been reviewed, patient examined, no change in status, stable for surgery.  I have reviewed the patient's chart and labs.  Questions were answered to the patient's satisfaction.     Marlowe Lawes D

## 2015-02-25 NOTE — Progress Notes (Signed)
Initial Nutrition Assessment  DOCUMENTATION CODES:   Not applicable  INTERVENTION:   Once diet advances, provide Ensure Enlive po once daily, each supplement provides 350 kcal and 20 grams of protein.  NUTRITION DIAGNOSIS:   Increased nutrient needs related to  (surgery) as evidenced by estimated needs.  GOAL:   Patient will meet greater than or equal to 90% of their needs  MONITOR:   Diet advancement, Labs, Weight trends, I & O's  REASON FOR ASSESSMENT:   Malnutrition Screening Tool    ASSESSMENT:   49 y.o. male with PMH of hypertension, diabetes mellitus, tobacco abuse, migraine headaches, depression, anxiety, who presents with left hip pain after fall.  Pt is currently NPO for surgery today. Pt reports having a good appetite currently and PTA at home. Pt expresses great hunger during time of visit. Pt reports weight has been stable with usual body weight ~190 lbs. Pt is agreeable to Ensure once diet advances to aid in healing. RD to order.   Pt with no observed significant fat or muscle mass loss.   Labs and medications reviewed.   Diet Order:  Diet NPO time specified Except for: Sips with Meds  Skin:  Reviewed, no issues  Last BM:  10/5  Height:   Ht Readings from Last 1 Encounters:  02/25/15  (1.854 m)    Weight:   Wt Readings from Last 1 Encounters:  02/25/15 190 lb 0.6 oz (86.2 kg)    Ideal Body Weight:  83.6 kg  BMI:  Body mass index is 25.08 kg/(m^2).  Estimated Nutritional Needs:   Kcal:  2150-2350  Protein:  105-115 grams  Fluid:  2.1 - 2.3 L/day  EDUCATION NEEDS:   No education needs identified at this time  Roslyn Smiling, MS, RD, LDN Pager # [phone] After hours/ weekend pager # [phone]

## 2015-02-25 NOTE — Anesthesia Preprocedure Evaluation (Addendum)
Anesthesia Evaluation  Patient identified by MRN, date of birth, ID band Patient awake    Reviewed: Allergy & Precautions, NPO status   History of Anesthesia Complications Negative for: history of anesthetic complications  Airway Mallampati: I  TM Distance: >3 FB Neck ROM: Full    Dental  (+) Chipped, Missing, Poor Dentition, Dental Advisory Given,    Pulmonary Current Smoker,    Pulmonary exam normal        Cardiovascular hypertension, Pt. on medications Normal cardiovascular exam     Neuro/Psych  Headaches, PSYCHIATRIC DISORDERS Anxiety Depression    GI/Hepatic Neg liver ROS, GERD  ,  Endo/Other  diabetes  Renal/GU negative Renal ROS     Musculoskeletal   Abdominal   Peds  Hematology   Anesthesia Other Findings   Reproductive/Obstetrics                          Anesthesia Physical Anesthesia Plan  ASA: III  Anesthesia Plan: MAC and Spinal   Post-op Pain Management:    Induction: Intravenous  Airway Management Planned:   Additional Equipment:   Intra-op Plan:   Post-operative Plan:   Informed Consent: I have reviewed the patients History and Physical, chart, labs and discussed the procedure including the risks, benefits and alternatives for the proposed anesthesia with the patient or authorized representative who has indicated his/her understanding and acceptance.   Dental advisory given  Plan Discussed with: CRNA, Anesthesiologist and Surgeon  Anesthesia Plan Comments:        Anesthesia Quick Evaluation

## 2015-02-25 NOTE — H&P (View-Only) (Signed)
ORTHOPAEDIC CONSULTATION  REQUESTING PHYSICIAN: Geradine Girt, DO  Chief Complaint: L hip pain  HPI: Peter Zimmerman is a 49 y.o. male who complains of L hip pain after a mechanical fall at home.  Per the patient's report he fell off 2 steps on the front porch of his home.  He was unable to weight bear without significant discomfort after the fall.  He denies hitting his head or LOC.  He has a history of R leg injury back in 2000 in which he has had multiple surgeries.  He is managed by a chronic pain clinic for his R leg pain as well as a spinal cord stimulator.  He was ambulatory with a walker or cane prior to the fall.  In the ED, imaging revealed a comminuted R intertrochanteric femur fracture. He denies any numbness or tingling down the L leg.   Past Medical History  Diagnosis Date  . Hypertension   . Diabetes mellitus   . Anxiety   . Depression   . GERD (gastroesophageal reflux disease)   . Hyperlipidemia   . Chronic pain syndrome   . Suicidal ideation     history off  . Migraine 2005   Past Surgical History  Procedure Laterality Date  . Right tibial surgery      x 3  . Knee arthroscopy Right 03/02/2014    Procedure: RIGHT KNEE ARTHROSCOPY;  Surgeon: Vickey Huger, MD;  Location: [place];  Service: Orthopedics;  Laterality: Right;   Social History   Social History  . Marital Status: Married    Spouse Name: N/A  . Number of Children: N/A  . Years of Education: N/A   Social History Main Topics  . Smoking status: Current Every Day Smoker  . Smokeless tobacco: None  . Alcohol Use: No  . Drug Use: No  . Sexual Activity: Yes   Other Topics Concern  . None   Social History Narrative   Family History  Problem Relation Age of Onset  . Hypertension Other    Allergies  Allergen Reactions  . Methadone Hcl Other (See Comments)    REACTION: ? Overdose   Prior to Admission medications   Medication Sig Start Date End Date Taking? Authorizing Provider    Diclofenac Potassium (ZIPSOR) 25 MG CAPS Take 25 mg by mouth 2 (two) times a week.   Yes Historical Provider, MD  lidocaine (LIDODERM) 5 % Place 1 patch onto the skin daily as needed (for pain). Remove & Discard patch within 12 hours or as directed by MD   Yes Historical Provider, MD  lisinopril (PRINIVIL,ZESTRIL) 10 MG tablet Take 10 mg by mouth daily.   Yes Historical Provider, MD  LORazepam (ATIVAN) 2 MG tablet Take 1-2 mg by mouth 3 (three) times daily.    Yes Historical Provider, MD  metFORMIN (GLUCOPHAGE) 500 MG tablet Take 500 mg by mouth daily as needed (for sugar).    Yes Historical Provider, MD  Multiple Vitamins-Minerals (MULTIVITAMIN WITH MINERALS) tablet Take 1 tablet by mouth daily.   Yes Historical Provider, MD  omeprazole (PRILOSEC) 40 MG capsule Take 40 mg by mouth daily.    Yes Historical Provider, MD  oxyCODONE-acetaminophen (PERCOCET) 10-325 MG per tablet Take 1 tablet by mouth 5 (five) times daily.    Yes Historical Provider, MD  topiramate (TOPAMAX) 100 MG tablet Take 100 mg by mouth 2 (two) times daily.   Yes Historical Provider, MD  Naloxone HCl 0.4 MG/0.4ML SOAJ Inject 0.4 mg  as directed once. Incase of overdose    Historical Provider, MD   Dg Pelvis Portable  02/25/2015   CLINICAL DATA:  49 year old male with hip pain after a fall. Transferred from [facility] with intertrochanteric femur fracture. Initial encounter.  EXAM: PORTABLE PELVIS 1-2 VIEWS  COMPARISON:  [facility] left hip series 10516.  FINDINGS: Portable AP view of the pelvis at 0845 hrs. Comminuted proximal left femur intertrochanteric fracture appears stable in configuration since yesterday. Pelvis appears intact. Spinal stimulator type generator device projects over the left lumbosacral junction. Proximal right femur appears grossly intact.  IMPRESSION: Stable comminuted left femur intertrochanteric fracture since yesterday.   Electronically Signed   By: Genevie Ann M.D.   On: 02/25/2015 09:11     Positive ROS: All other systems have been reviewed and were otherwise negative with the exception of those mentioned in the HPI and as above.  Labs cbc  Recent Labs  02/25/15 0147 02/25/15 0603  WBC 11.9* 10.8*  HGB 11.4* 11.1*  HCT 34.1* 32.9*  PLT 234 221    Labs inflam No results for input(s): CRP in the last 72 hours.  Invalid input(s): ESR  Labs coag  Recent Labs  02/25/15 0147  INR 1.15     Recent Labs  02/25/15 0147 02/25/15 0603  NA 135 132*  K 3.6 3.5  CL 107 103  CO2 22 23  GLUCOSE 98 104*  BUN 12 10  CREATININE 0.67 0.69  CALCIUM 8.5* 8.2*    Physical Exam: Filed Vitals:   02/25/15 1043  BP: 100/59  Pulse:   Temp:   Resp:    General: Alert, no acute distress Cardiovascular: No pedal edema Respiratory: No cyanosis, no use of accessory musculature GI: No organomegaly, abdomen is soft and non-tender Skin: No lesions in the area of chief complaint other than those listed below in MSK exam.  Neurologic: Sensation intact distally Psychiatric: Patient is competent for consent with normal mood and affect Lymphatic: No axillary or cervical lymphadenopathy  MUSCULOSKELETAL:  L hip has not swelling or erythema.  Pain with ROM and log roll.  Sensation is intact with 2+ distal pulses.  Other extremities are atraumatic with painless ROM and NVI.  Assessment: L intertrochanteric femur fracture after mechanical fall at home  Plan: Plan to take to the OR today for ORIF of the L hip to maximize mobilization given limitations of R leg from previous injury.  Will remain on bedrest and NPO until surgery.     Gae Dry, PA-C Cell [phone]   02/25/2015 11:54 AM

## 2015-02-25 NOTE — H&P (Signed)
[facility] History and Physical  Peter Zimmerman ZOX:[id number] DOB: [birth year] DOA: 02/25/2015  Referring physician: ED physician PCP: [name], MD  Specialists:   Chief Complaint: leg hip pain after fall  HPI: Peter Zimmerman is a 49 y.o. male with PMH of hypertension, diabetes mellitus, tobacco abuse, migraine headaches, depression, anxiety, who presents with left hip pain after fall.  Pt is transferred from [facility] per EDP, Dr. Marvis Moeller as recommended by orthopedic surgeon, Dr. Luiz Blare.  Patient reports that he has chronic right leg pain due to history of injury. He is using spinal cord stimulator currently. He states that he fell off 2 steps in his home front port accidentally, and developed severe pain over left hip. He does not have numbness or tingling is in left leg. Patient does not have chest pain, shortness of breath, cough, abdominal pain, diarrhea, symptoms of UTI.  In ED, patient was found to have comminuted intertrochanteric femur fracture on the left varus angulation at the fracture site by X-ray. No lab was done yet. Orthopedic surgeon was consulted by ED.  Where does patient live?   At home  Can patient participate in ADLs?  Yes  Review of Systems:   General: no fevers, chills, no changes in body weight, has fatigue HEENT: no blurry vision, hearing changes or sore throat Pulm: no dyspnea, coughing, wheezing CV: no chest pain, palpitations Abd: no nausea, vomiting, abdominal pain, diarrhea, constipation GU: no dysuria, burning on urination, increased urinary frequency, hematuria  Ext: no leg edema Neuro: no unilateral weakness, numbness, or tingling, no vision change or hearing loss Skin: no rash MSK: Has chronic right leg pain, and acute left hip pain Heme: No easy bruising.  Travel history: No recent long distant travel.  Allergy:  Allergies  Allergen Reactions  . Methadone Hcl Other (See Comments)    REACTION: ? Overdose    Past  Medical History  Diagnosis Date  . Hypertension   . Diabetes mellitus   . Anxiety   . Depression   . GERD (gastroesophageal reflux disease)   . Hyperlipidemia   . Chronic pain syndrome   . Suicidal ideation     history off  . Migraine 2005    Past Surgical History  Procedure Laterality Date  . Right tibial surgery      x 3  . Knee arthroscopy Right 03/02/2014    Procedure: RIGHT KNEE ARTHROSCOPY;  Surgeon: Dannielle Huh, MD;  Location: [facility] OR;  Service: Orthopedics;  Laterality: Right;    Social History:  reports that he has been smoking.  He does not have any smokeless tobacco history on file. He reports that he does not drink alcohol or use illicit drugs.  Family History:  Family History  Problem Relation Age of Onset  . Hypertension Other      Prior to Admission medications   Medication Sig Start Date End Date Taking? Authorizing Provider  Chlorzoxazone (LORZONE) 750 MG TABS Take 750 mg by mouth daily.    Historical Provider, MD  lidocaine (LIDODERM) 5 % Place 1 patch onto the skin daily as needed (for pain). Remove & Discard patch within 12 hours or as directed by MD    Historical Provider, MD  lisinopril (PRINIVIL,ZESTRIL) 10 MG tablet Take 10 mg by mouth daily.    Historical Provider, MD  LORazepam (ATIVAN) 2 MG tablet Take 1-2 mg by mouth 3 (three) times daily.     Historical Provider, MD  metFORMIN (GLUCOPHAGE) 500 MG tablet Take 500  mg by mouth daily as needed (for sugar).     Historical Provider, MD  Naloxone HCl 0.4 MG/0.4ML SOAJ Inject 0.4 mg as directed once. Incase of overdose    Historical Provider, MD  omeprazole (PRILOSEC) 40 MG capsule Take 40 mg by mouth 2 (two) times daily.    Historical Provider, MD  oxyCODONE-acetaminophen (PERCOCET) 10-325 MG per tablet Take 1 tablet by mouth 5 (five) times daily.     Historical Provider, MD  oxyCODONE-acetaminophen (PERCOCET) 10-325 MG per tablet Take 1 tablet by mouth every 6 (six) hours as needed for pain. 03/02/14    Dan Humphreys, PA-C  sucralfate (CARAFATE) 1 G tablet Take 1 g by mouth 4 (four) times daily.    Historical Provider, MD  topiramate (TOPAMAX) 100 MG tablet Take 100 mg by mouth 2 (two) times daily.    Historical Provider, MD    Physical Exam: Filed Vitals:   02/25/15 0035  BP: 122/69  Pulse: 81  Temp: 97.9 F (36.6 C)  TempSrc: Oral  Resp: 18  Height:  (1.854 m)  Weight: 86.2 kg (190 lb 0.6 oz)  SpO2: 100%   General: Not in acute distress HEENT:       Eyes: PERRL, EOMI, no scleral icterus.       ENT: No discharge from the ears and nose, no pharynx injection, no tonsillar enlargement.        Neck: No JVD, no bruit, no mass felt. Heme: No neck lymph node enlargement. Cardiac: S1/S2, RRR, No murmurs, No gallops or rubs. Pulm: No rales, wheezing, rhonchi or rubs. Abd: Soft, nondistended, nontender, no rebound pain, no organomegaly, BS present. Ext: No pitting leg edema bilaterally. 2+DP/PT pulse bilaterally. Musculoskeletal: Severe tenderness over left hip. Skin: No rashes.  Neuro: Alert, oriented X3, cranial nerves II-XII grossly intact, muscle strength 5/5 in all extremities, sensation to light touch intact. Brachial reflex 1+ bilaterally. Knee reflex 1+ bilaterally. Psych: Patient is not psychotic, no suicidal or hemocidal ideation.  Labs on Admission:  Basic Metabolic Panel:  Recent Labs Lab 02/25/15 0147  NA 135  K 3.6  CL 107  CO2 22  GLUCOSE 98  BUN 12  CREATININE 0.67  CALCIUM 8.5*   Liver Function Tests:  Recent Labs Lab 02/25/15 0147  AST 16  ALT 12*  ALKPHOS 51  BILITOT 0.5  PROT 5.6*  ALBUMIN 3.4*   No results for input(s): LIPASE, AMYLASE in the last 168 hours. No results for input(s): AMMONIA in the last 168 hours. CBC:  Recent Labs Lab 02/25/15 0147  WBC 11.9*  NEUTROABS 8.0*  HGB 11.4*  HCT 34.1*  MCV 91.2  PLT 234   Cardiac Enzymes: No results for input(s): CKTOTAL, CKMB, CKMBINDEX, TROPONINI in the last 168 hours.  BNP  (last 3 results) No results for input(s): BNP in the last 8760 hours.  ProBNP (last 3 results) No results for input(s): PROBNP in the last 8760 hours.  CBG:  Recent Labs Lab 02/25/15 0047  GLUCAP 112*    Radiological Exams on Admission: No results found.  EKG: Not done in ED, will get one.   Assessment/Plan Principal Problem:   Intertrochanteric fracture of left hip (HCC) Active Problems:   Anxiety state   Depression   Essential hypertension   GERD   Migraine   Hip fracture (HCC)   Diabetes mellitus without complication (HCC)   Intertrochanteric fracture of left hip: As evidenced by x-ray. Patient has severe pain now. No neurovascular compromise. Orthopedic surgeon was consulted.  Per Dr. Luiz Blare, pt will be scheduled for surgery 10/6 by Dr. Renaye Rakers who will see the patient in the AM on 10/6.  - will admit to tele bed - Pain control: morphine prn and percocet - follow up ortho recs - NPO after MN - type and cross - INR/PTT - Check CBC and CMP - EKG  Anxiety: Stable, no suicidal or homicidal ideations. -Continue home medications: Ativan  HTN: -Continue home medications, lisinopril  GERD: -Protonix and Carafate  Migraine: - Topamax  DM-II: Last A1c 6.4 on 07/04/10, well controled. Patient is taking metformin at home -SSI -Check A1c   DVT ppx: SCD  Code Status: Full code Family Communication: None at bed side.    Disposition Plan: Admit to inpatient   Date of Service 02/25/2015    Lorretta Harp [facility] Pager [phone]  If 7PM-7AM, please contact night-coverage [email or web address] Password [email or web address] 02/25/2015, 4:58 AM

## 2015-02-25 NOTE — Transfer of Care (Signed)
Immediate Anesthesia Transfer of Care Note  Patient: Peter Zimmerman  Procedure(s) Performed: Procedure(s): INTRAMEDULLARY (IM) NAIL INTERTROCHANTRIC (Left)  Patient Location: PACU  Anesthesia Type:MAC and Spinal  Level of Consciousness: awake  Airway & Oxygen Therapy: Patient Spontanous Breathing and Patient connected to nasal cannula oxygen  Post-op Assessment: Report given to RN  Post vital signs: Reviewed and stable  Last Vitals:  Filed Vitals:   02/25/15 1312  BP: 119/59  Pulse: 70  Temp: 37.1 C  Resp: 16    Complications: No apparent anesthesia complications

## 2015-02-25 NOTE — Anesthesia Postprocedure Evaluation (Signed)
Anesthesia Post Note  Patient: Peter Zimmerman  Procedure(s) Performed: Procedure(s) (LRB): INTRAMEDULLARY (IM) NAIL INTERTROCHANTRIC (Left)  Anesthesia type: MAC/SAB  Patient location: PACU  Post pain: Pain level controlled  Post assessment: Patient's Cardiovascular Status Stable, SAB receding  Last Vitals:  Filed Vitals:   02/25/15 1748  BP:   Pulse: 88  Temp: 36.4 C  Resp: 17    Post vital signs: Reviewed and stable  Level of consciousness: sedated  Complications: No apparent anesthesia complications

## 2015-02-25 NOTE — Discharge Instructions (Signed)

## 2015-02-26 ENCOUNTER — Encounter (HOSPITAL_COMMUNITY): Payer: Self-pay | Admitting: Orthopedic Surgery

## 2015-02-26 LAB — CBC
HCT: 30 % — ABNORMAL LOW (ref 39.0–52.0)
Hemoglobin: 10.1 g/dL — ABNORMAL LOW (ref 13.0–17.0)
MCH: 30.6 pg (ref 26.0–34.0)
MCHC: 33.7 g/dL (ref 30.0–36.0)
MCV: 90.9 fL (ref 78.0–100.0)
PLATELETS: 219 10*3/uL (ref 150–400)
RBC: 3.3 MIL/uL — AB (ref 4.22–5.81)
RDW: 13 % (ref 11.5–15.5)
WBC: 10.1 10*3/uL (ref 4.0–10.5)

## 2015-02-26 LAB — BASIC METABOLIC PANEL
Anion gap: 10 (ref 5–15)
BUN: 8 mg/dL (ref 6–20)
CO2: 21 mmol/L — ABNORMAL LOW (ref 22–32)
Calcium: 8.3 mg/dL — ABNORMAL LOW (ref 8.9–10.3)
Chloride: 103 mmol/L (ref 101–111)
Creatinine, Ser: 0.69 mg/dL (ref 0.61–1.24)
GFR calc Af Amer: >60 mL/min (ref >60)
GLUCOSE: 139 mg/dL — AB (ref 65–99)
POTASSIUM: 3.8 mmol/L (ref 3.5–5.1)
Sodium: 134 mmol/L — ABNORMAL LOW (ref 135–145)

## 2015-02-26 LAB — HEMOGLOBIN A1C
HEMOGLOBIN A1C: 5.8 % — AB (ref 4.8–5.6)
MEAN PLASMA GLUCOSE: 120 mg/dL

## 2015-02-26 LAB — GLUCOSE, CAPILLARY
GLUCOSE-CAPILLARY: 75 mg/dL (ref 65–99)
GLUCOSE-CAPILLARY: 126 mg/dL — AB (ref 65–99)
GLUCOSE-CAPILLARY: 166 mg/dL — AB (ref 65–99)
Glucose-Capillary: 80 mg/dL (ref 65–99)
Glucose-Capillary: 177 mg/dL — ABNORMAL HIGH (ref 65–99)
Glucose-Capillary: 142 mg/dL — ABNORMAL HIGH (ref 65–99)

## 2015-02-26 MED ORDER — LORAZEPAM 1 MG PO TABS
1.0000 mg | ORAL_TABLET | Freq: Two times a day (BID) | ORAL | Status: DC
  Administered 2015-02-26: 2 mg via ORAL
  Filled 2015-02-26: qty 2

## 2015-02-26 NOTE — Evaluation (Signed)
Physical Therapy Evaluation Patient Details Name: Peter Zimmerman MRN: [id number] DOB: [birth year] Today's Date: 02/26/2015   History of Present Illness  Pt is a 49 y/o s/p fall on steps at home admitted w/ Lt hip fx and now s/p Lt hip IM nail.  Pt's PMH includes DM, anxiety and depression, chronic pain syndrome, migraine, suicidal ideation, Rt tibial surgery and Rt knee arthroscopy.  Clinical Impression  Patient is s/p above surgery resulting in functional limitations due to the deficits listed below (see PT Problem List). Peter Zimmerman is very motivated to return home as soon as possible but is limited by fatigue and pain.  Pt demonstrated ability to ambulate 85 ft and ascend/descend 12 steps, both w/ min guard assist, this session.  He is requiring min assist for sit>stand at this time.  Pt will benefit from additional skilled therapy in the acute care setting; however, pt is safe to d/c home w/ 24/7 min assist from family and friends.  Patient will benefit from skilled PT to increase their independence and safety with mobility to allow discharge to the venue listed below.      Follow Up Recommendations Home health PT;Supervision for mobility/OOB    Equipment Recommendations  Crutches;3in1 (PT) (Although pt reports to CM that he has BSC in attic)    Recommendations for Other Services OT consult     Precautions / Restrictions Precautions Precautions: Fall Precaution Comments: Pt reports h/o Rt knee buckling (cause for fall) Required Braces or Orthoses: Other Brace/Splint Other Brace/Splint: Soft Rt knee brace that pt wears at baseline for Rt knee instability Restrictions Weight Bearing Restrictions: Yes LLE Weight Bearing: Weight bearing as tolerated      Mobility  Bed Mobility Overal bed mobility: Needs Assistance Bed Mobility: Supine to Sit     Supine to sit: Min assist     General bed mobility comments: HOB flat and no use of bed rail to simulate home environment.  Min  assist managing Bil LEs to EOB.  Cues for technique to scoot to EOB w/ increased time.  Transfers Overall transfer level: Needs assistance Equipment used: Crutches Transfers: Sit to/from Stand Sit to Stand: From elevated surface;Min assist         General transfer comment: Demonstration and cues for technique sit<>stand using crutches.  Min assist to power up to standing as pt demonstrated mild instability.  Pt w/ successful sit>stand on second try after cues to flex Bil knees and scoot to EOB.  Verbal cues during stand>sit w/ controlled descent.  Ambulation/Gait Ambulation/Gait assistance: Min guard;+2 safety/equipment Ambulation Distance (Feet): 85 Feet Assistive device: Crutches Gait Pattern/deviations: Step-to pattern;Antalgic;Trunk flexed;Decreased weight shift to left;Decreased stride length;Decreased step length - left   Gait velocity interpretation: Below normal speed for age/gender General Gait Details: 4 point gait pattern w/ crutches.  Cues to allow Lt knee to bend as pt has tendency to shuffle Lt foot on floor.  Cues for upright standing which pt does not maintain.  No knee buckle noted Bil during session.  Stairs Stairs: Yes Stairs assistance: Min guard (Close min guard) Stair Management: One rail Right;Step to pattern;Forwards;With crutches Number of Stairs: 12 General stair comments: Demonstration and verbal cues for technique w/ crutch in Lt UE and use of Rt rail.  Lt hip hike present during ascent.  Cues to position Lt crutch more laterally during descent.  Several standing rest breaks 2/2 pain and generalized fatigue.  Wheelchair Mobility    Modified Rankin (Stroke Patients Only)  Balance Overall balance assessment: Needs assistance Sitting-balance support: Bilateral upper extremity supported;Feet supported Sitting balance-Leahy Scale: Fair Sitting balance - Comments: Min guard>supervision 2/2 pt's report of generalized fatigue   Standing balance  support: Single extremity supported;During functional activity Standing balance-Leahy Scale: Fair Standing balance comment: Pt able to stand w/ min guard assist while voiding.  Able to switch crutches to one UE while standing EOB and in front of chair w/ min guard assist.                             Pertinent Vitals/Pain Pain Assessment: 0-10 Pain Score: 7  Pain Location: Lt hip Pain Descriptors / Indicators: Sore;Grimacing;Guarding Pain Intervention(s): Limited activity within patient's tolerance;Monitored during session;Repositioned;Patient requesting pain meds-RN notified;Utilized relaxation techniques    Home Living Family/patient expects to be discharged to:: Private residence Living Arrangements: Spouse/significant other (wife) Available Help at Discharge: Family;Friend(s);Available 24 hours/day (wife works, has friends who can stay w/ pt during day) Type of Home: House Home Access: Stairs to enter Entrance Stairs-Rails: Can reach both Entrance Stairs-Number of Steps: 8 Home Layout: One level Home Equipment: Crutches;Other (comment) (Mother has RW that he can borrow if necessary) Additional Comments: Pt's wife works during the day but pt reports he has multiple friends who will be able to stay w/ him 24/7 at d/c. His toilets are standard height at home.    Prior Function Level of Independence: Needs assistance   Gait / Transfers Assistance Needed: If pt were to use tub he would need assist for transfer; however, pt Ind PTA w/ transfer into tub shower           Hand Dominance   Dominant Hand: Right    Extremity/Trunk Assessment   Upper Extremity Assessment: Overall WFL for tasks assessed           Lower Extremity Assessment: RLE deficits/detail;LLE deficits/detail RLE Deficits / Details: weakness and limited ROM in Rt knee 2/2 extensive Rt LE surgical history.  Pt reports occasional Rt knee buckle at baseline 2/2 weakness (cause for fall and  admission) LLE Deficits / Details: weakness and limited ROM as expected s/p Lt IM nail surgery  Cervical / Trunk Assessment: Kyphotic  Communication   Communication: No difficulties  Cognition Arousal/Alertness: Awake/alert Behavior During Therapy: WFL for tasks assessed/performed Overall Cognitive Status: Within Functional Limits for tasks assessed                      General Comments General comments (skin integrity, edema, etc.): Pt encouraged t call his son to arrange for him to assist him up the stairs at d/c.  SpO2 remained above 95% throughout duration of sesesion on RA.  Pt's soft Rt knee brace donned in sitting w/ min assist from therapist. Discussed technique for car transfer.    Exercises General Exercises - Lower Extremity Ankle Circles/Pumps: AROM;Both;15 reps;Supine Quad Sets: Strengthening;Both;10 reps;Supine Gluteal Sets: Strengthening;Both;10 reps;Supine      Assessment/Plan    PT Assessment Patient needs continued PT services  PT Diagnosis Difficulty walking;Abnormality of gait;Generalized weakness;Acute pain   PT Problem List Decreased strength;Decreased range of motion;Decreased activity tolerance;Decreased balance;Decreased mobility;Decreased knowledge of use of DME;Decreased safety awareness;Decreased knowledge of precautions;Decreased skin integrity;Pain  PT Treatment Interventions DME instruction;Gait training;Stair training;Functional mobility training;Therapeutic activities;Therapeutic exercise;Balance training;Neuromuscular re-education;Patient/family education;Modalities   PT Goals (Current goals can be found in the Care Plan section) Acute Rehab PT Goals Patient Stated Goal: to go home today  PT Goal Formulation: With patient Time For Goal Achievement: 03/05/15 Potential to Achieve Goals: Good    Frequency Min 5X/week   Barriers to discharge Inaccessible home environment 8 steps to enter home    Co-evaluation               End of  Session Equipment Utilized During Treatment: Gait belt;Other (comment) (Rt knee brace) Activity Tolerance: Patient limited by fatigue;Patient limited by pain Patient left: in chair;with call bell/phone within reach Nurse Communication: Mobility status;Precautions;Weight bearing status;Patient requests pain meds;Other (comment) (Pt eager to be d/c today, will need BSC prior to d/c)         Time: 1610-9604 PT Time Calculation (min) (ACUTE ONLY): 62 min   Charges:   PT Evaluation $Initial PT Evaluation Tier I: 1 Procedure PT Treatments $Gait Training: 38-52 mins   PT G Codes:       Michail Jewels PT, DPT [phone] Pager: [phone] 02/26/2015, 4:01 PM

## 2015-02-26 NOTE — Care Management Note (Addendum)
Case Management Note  Patient Details  Name: Peter Zimmerman MRN: [id number] Date of Birth: Sep 10, 1965  Subjective/Objective:           Admitted with intertrochanteric fracture of left hip, s/p IM nail         Action/Plan: Spoke with patient about home health, he selected [facility]. Contacted [facility], they can not work with the patient for several days. Patient selected [facility], contacted [facility] with [facility], set up HHPT. Patient has crutches and stated that he thinks he has a 3N1 at home. Informed patient that his insurance does not cover 3N1 so MD order not needed if he needs to purchase one. Patient states that he will have family to assist him after discharge.  OT recommended HHOT, contacted Tiffany at [facility] and set up HHOT.      Expected Discharge Date:                  Expected Discharge Plan:  Home w Home Health Services  In-House Referral:  NA  Discharge planning Services  CM Consult  Post Acute Care Choice:  Home Health Choice offered to:  Patient  DME Arranged:    DME Agency:     HH Arranged:  PT HH Agency:  [facility]  Status of Service:  Completed, signed off  Medicare Important Message Given:    Date Medicare IM Given:    Medicare IM give by:    Date Additional Medicare IM Given:    Additional Medicare Important Message give by:     If discussed at Long Length of Stay Meetings, dates discussed:    Additional Comments:  Monica Becton, RN 02/26/2015, 3:32 PM

## 2015-02-26 NOTE — Progress Notes (Addendum)
PROGRESS NOTE  Peter Zimmerman WUJ:[id number] DOB: 11-30-1965 DOA: 02/25/2015 PCP: [name], MD  Assessment/Plan: Intertrochanteric fracture of left hip: S/p repair -pain continues to be an issue as on chronic medications and follows in pain clinic-- under contract  Anxiety: Stable, no suicidal or homicidal ideations. -Continue home medications: Ativan  HTN: -Continue home medications, lisinopril  GERD: -Protonix and Carafate  Migraine: - Topamax  DM-II: Last A1c 6.4 on 07/04/10, well controled. Patient is taking metformin at home     Code Status: full Family Communication:  Disposition Plan:    Consultants:    Procedures:      HPI/Subjective: C/o pain  Objective: Filed Vitals:   02/26/15 0901  BP: 117/50  Pulse:   Temp:   Resp:     Intake/Output Summary (Last 24 hours) at 02/26/15 1229 Last data filed at 02/26/15 0600  Gross per 24 hour  Intake   1330 ml  Output    625 ml  Net    705 ml   Filed Weights   02/25/15 0035  Weight: 86.2 kg (190 lb 0.6 oz)    Exam:   General:  Awake, NAD  Cardiovascular: rrr  Respiratory: clear  Abdomen: +BS, soft  Musculoskeletal: no edema   Data Reviewed: Basic Metabolic Panel:  Recent Labs Lab 02/25/15 0147 02/25/15 0603 02/26/15 0501  NA 135 132* 134*  K 3.6 3.5 3.8  CL 107 103 103  CO2 22 23 21*  GLUCOSE 98 104* 139*  BUN CREATININE 0.67 0.69 0.69  CALCIUM 8.5* 8.2* 8.3*   Liver Function Tests:  Recent Labs Lab 02/25/15 0147  AST 16  ALT 12*  ALKPHOS 51  BILITOT 0.5  PROT 5.6*  ALBUMIN 3.4*   No results for input(s): LIPASE, AMYLASE in the last 168 hours. No results for input(s): AMMONIA in the last 168 hours. CBC:  Recent Labs Lab 02/25/15 0147 02/25/15 0603 02/26/15 0501  WBC 11.9* 10.8* 10.1  NEUTROABS 8.0*  --   --   HGB 11.4* 11.1* 10.1*  HCT 34.1* 32.9* 30.0*  MCV 91.2 91.9 90.9  PLT 234 221 219   Cardiac Enzymes: No results for input(s):  CKTOTAL, CKMB, CKMBINDEX, TROPONINI in the last 168 hours. BNP (last 3 results) No results for input(s): BNP in the last 8760 hours.  ProBNP (last 3 results) No results for input(s): PROBNP in the last 8760 hours.  CBG:  Recent Labs Lab 02/25/15 1405 02/25/15 1644 02/25/15 2156 02/26/15 0606 02/26/15 1141  GLUCAP 80 75 177* 142* 126*    Recent Results (from the past 240 hour(s))  Surgical pcr screen     Status: None   Collection Time: 02/25/15  5:42 AM  Result Value Ref Range Status   MRSA, PCR NEGATIVE NEGATIVE Final   Staphylococcus aureus NEGATIVE NEGATIVE Final    Comment:        The Xpert SA Assay (FDA approved for NASAL specimens in patients over 63 years of age), is one component of a comprehensive surveillance program.  Test performance has been validated by [facility] for patients greater than or equal to 29 year old. It is not intended to diagnose infection nor to guide or monitor treatment.      Studies: Pelvis Portable  02/25/2015   CLINICAL DATA:  Status post ORIF of proximal left femoral fracture  EXAM: PORTABLE PELVIS 1-2 VIEWS  COMPARISON:  Intraoperative film from the same day  FINDINGS: Medullary rod and proximal fixation screw are noted  in satisfactory position. The fracture fragments are in near anatomic alignment. The pelvic ring is intact. A spinal stimulator is noted.  IMPRESSION: Status post ORIF of proximal left femoral fracture.   Electronically Signed   By: Alcide Clever M.D.   On: 02/25/2015 17:14   Dg Pelvis Portable  02/25/2015   CLINICAL DATA:  49 year old male with hip pain after a fall. Transferred from [facility] with intertrochanteric femur fracture. Initial encounter.  EXAM: PORTABLE PELVIS 1-2 VIEWS  COMPARISON:  [facility] left hip series 16109.  FINDINGS: Portable AP view of the pelvis at 0845 hrs. Comminuted proximal left femur intertrochanteric fracture appears stable in configuration since yesterday. Pelvis appears  intact. Spinal stimulator type generator device projects over the left lumbosacral junction. Proximal right femur appears grossly intact.  IMPRESSION: Stable comminuted left femur intertrochanteric fracture since yesterday.   Electronically Signed   By: Odessa Fleming M.D.   On: 02/25/2015 09:11   Dg C-arm 1-60 Min  02/25/2015   CLINICAL DATA:  ORIF left hip  EXAM: DG C-ARM 61-120 MIN; LEFT FEMUR 2 VIEWS  COMPARISON:  None  FLUOROSCOPY TIME:  1 minutes 21 seconds  Four images  FINDINGS: Interval placement of a left femoral IM nail with an interlocking left femoral neck screw transfixing an intertrochanteric fracture. No dislocation.  IMPRESSION: ORIF left intertrochanteric fracture.   Electronically Signed   By: Elige Ko   On: 02/25/2015 16:31   Dg Femur Min 2 Views Left  02/25/2015   CLINICAL DATA:  ORIF left hip  EXAM: DG C-ARM 61-120 MIN; LEFT FEMUR 2 VIEWS  COMPARISON:  None  FLUOROSCOPY TIME:  1 minutes 21 seconds  Four images  FINDINGS: Interval placement of a left femoral IM nail with an interlocking left femoral neck screw transfixing an intertrochanteric fracture. No dislocation.  IMPRESSION: ORIF left intertrochanteric fracture.   Electronically Signed   By: Elige Ko   On: 02/25/2015 16:31    Scheduled Meds: . aspirin EC  325 mg Oral Q breakfast  . docusate sodium  100 mg Oral BID  . feeding supplement (ENSURE ENLIVE)  237 mL Oral Q1500  . Influenza vac split quadrivalent PF  0.5 mL Intramuscular Tomorrow-1000  . insulin aspart  0-9 Units Subcutaneous TID WC  . lisinopril  10 mg Oral Daily  . LORazepam  1-2 mg Oral BID BM  . LORazepam  2 mg Oral QHS  . nicotine  21 mg Transdermal Daily  . pantoprazole  40 mg Oral Daily  . sucralfate  1 g Oral QID  . topiramate  100 mg Oral BID   Continuous Infusions: . lactated ringers 10 mL/hr at 02/25/15 1336   Antibiotics Given (last 72 hours)    Date/Time Action Medication Dose Rate   02/25/15 1515 Given   ceFAZolin (ANCEF) IVPB 2 g/50 mL  premix 2 g    02/25/15 2153 Given   ceFAZolin (ANCEF) IVPB 2 g/50 mL premix 2 g 100 mL/hr   02/26/15 0334 Given   ceFAZolin (ANCEF) IVPB 2 g/50 mL premix 2 g 100 mL/hr      Principal Problem:   Intertrochanteric fracture of left hip (HCC) Active Problems:   Anxiety state   Depression   Essential hypertension   GERD   Migraine   Hip fracture (HCC)   Diabetes mellitus without complication (HCC)    Time spent: 25 min    Tyreque Finken  [facility] Pager [phone]. If 7PM-7AM, please contact night-coverage at [email or web address], password [facility]  02/26/2015, 12:29 PM  LOS: 1 day

## 2015-02-26 NOTE — Progress Notes (Signed)
RN advised the nurse tech to hold off on collecting vital signs. Pt just finally got comfortable and out of pain enough to sleep. Will continue to monitor pt, and if he wakes up vital signs will be taken and addressed at that time.

## 2015-02-26 NOTE — Evaluation (Signed)
Occupational Therapy Evaluation Patient Details Name: Peter Zimmerman MRN: [id number] DOB: 1965-07-13 Today's Date: 02/26/2015    History of Present Illness Pt is a 49 y/o s/p fall on steps at home admitted w/ Lt hip fx and now s/p Lt hip IM nail.  Pt's PMH includes DM, anxiety and depression, chronic pain syndrome, migraine, suicidal ideation, Rt tibial surgery and Rt knee arthroscopy.   Clinical Impression   Pt reports he was independent with ADLs PTA. Pt declined activities with OT, stated he was very tired and had walked a lot with PT today. Educated pt on compensatory strategies for LB ADLs, tub transfer technique with 3 in 1, need for supervision during ADLs and functional mobility; pt verbalized understanding. Pt to d/c home with 24/7 supervision from family and friends as needed. Recommending HHOT to maximize pts independence and safety with ADLs and functional mobility upon d/c home. All further OT needs can be addressed at next venue of care.     Follow Up Recommendations  Home health OT;Supervision - Intermittent    Equipment Recommendations  None recommended by OT    Recommendations for Other Services       Precautions / Restrictions Precautions Precautions: Fall Precaution Comments: Pt reports h/o Rt knee buckling (cause for fall) Required Braces or Orthoses: Other Brace/Splint Other Brace/Splint: Soft Rt knee brace that pt wears at baseline for Rt knee instability Restrictions Weight Bearing Restrictions: Yes LLE Weight Bearing: Weight bearing as tolerated      Mobility Bed Mobility Overal bed mobility: Needs Assistance Bed Mobility: Supine to Sit     Supine to sit: Min assist     General bed mobility comments: Pt in recliner for session. Per PT report: HOB flat and no use of bed rail to simulate home environment.  Min assist managing Bil LEs to EOB.  Cues for technique to scoot to EOB w/ increased time.  Transfers Overall transfer level: Needs  assistance Equipment used: Crutches Transfers: Sit to/from Stand Sit to Stand: From elevated surface;Min assist         General transfer comment: Per PT report: Demonstration and cues for technique sit<>stand using crutches.  Min assist to power up to standing as pt demonstrated mild instability.  Pt w/ successful sit>stand on second try after cues to flex Bil knees and scoot to EOB.  Verbal cues during stand>sit w/ controlled descent.    Balance Overall balance assessment: Needs assistance Sitting-balance support: Bilateral upper extremity supported;Feet supported Sitting balance-Leahy Scale: Fair Sitting balance - Comments: Min guard>supervision 2/2 pt's report of generalized fatigue   Standing balance support: Single extremity supported;During functional activity Standing balance-Leahy Scale: Fair Standing balance comment: Per PT report                            ADL Overall ADL's : Needs assistance/impaired     Grooming: Set up;Sitting   Upper Body Bathing: Set up;Sitting       Upper Body Dressing : Set up;Sitting                     General ADL Comments: No family present during session. Pt declined getting up to work with OT, stated that he has already walked today and is very tired. Max encouragement given, pt still declined. Pt reports that his wife can help with ADLs upon d/c as needed. Educated pt on compensatory strategies for LB ADLs, tub transfer technique with 3 in  1, safety with ADLs and need for supervision; pt verbalized understanding.      Vision     Perception     Praxis      Pertinent Vitals/Pain Pain Assessment: Faces Pain Score: 7  Faces Pain Scale: Hurts even more Pain Location: L hip Pain Descriptors / Indicators: Sore Pain Intervention(s): Limited activity within patient's tolerance;Monitored during session     Hand Dominance Right   Extremity/Trunk Assessment Upper Extremity Assessment Upper Extremity Assessment:  Overall WFL for tasks assessed      Cervical / Trunk Assessment Cervical / Trunk Assessment: Kyphotic   Communication Communication Communication: No difficulties   Cognition Arousal/Alertness: Lethargic;Suspect due to medications Behavior During Therapy: [facility] for tasks assessed/performed Overall Cognitive Status: Within Functional Limits for tasks assessed                     General Comments       Exercises Exercises: General Lower Extremity     Shoulder Instructions      Home Living Family/patient expects to be discharged to:: Private residence Living Arrangements: Spouse/significant other (wife) Available Help at Discharge: Family;Friend(s);Available 24 hours/day Type of Home: House Home Access: Stairs to enter [employer] of Steps: 8 Entrance Stairs-Rails: Can reach both Home Layout: One level     Bathroom Shower/Tub: Chief Strategy Officer: Standard Bathroom Accessibility: Yes How Accessible: Other (comment) (accessible via crutches) Home Equipment: Crutches;Other (comment) (Mother has RW that he can borrow if necessary)   Additional Comments: Pt's wife works during the day but pt reports he has multiple friends who will be able to stay w/ him 24/7 at d/c.      Prior Functioning/Environment Level of Independence: Needs assistance  Gait / Transfers Assistance Needed: If pt were to use tub he would need assist for transfer; however, pt Ind PTA w/ transfer into tub shower          OT Diagnosis: Generalized weakness;Acute pain   OT Problem List:     OT Treatment/Interventions:      OT Goals(Current goals can be found in the care plan section) Acute Rehab OT Goals Patient Stated Goal: to go home today OT Goal Formulation: With patient  OT Frequency:     Barriers to D/C:            Co-evaluation              End of Session    Activity Tolerance: Patient limited by lethargy Patient left: in chair;with call  bell/phone within reach   Time: 1610-9604 OT Time Calculation (min): 15 min Charges:  OT General Charges $OT Visit: 1 Procedure OT Evaluation $Initial OT Evaluation Tier I: 1 Procedure G-Codes:     Peter Zimmerman M.S., OTR/L Pager: [phone]  02/26/2015, 4:15 PM

## 2015-02-26 NOTE — Discharge Summary (Signed)
Physician Discharge Summary  Peter Zimmerman:[id number] DOB: May 29, 1965 DOA: 02/25/2015  PCP: [name], MD  Admit date: 02/25/2015 Discharge date: 02/26/2015  Time spent: 35 minutes  Recommendations for Outpatient Follow-up:  1. PCP 1 week  Discharge Diagnoses:  Principal Problem:   Intertrochanteric fracture of left hip (HCC) Active Problems:   Anxiety state   Depression   Essential hypertension   GERD   Migraine   Hip fracture (HCC)   Diabetes mellitus without complication (HCC)   Discharge Condition: improved  Diet recommendation: cardiac  Filed Weights   02/25/15 0035  Weight: 86.2 kg (190 lb 0.6 oz)    History of present illness:  Peter Zimmerman is a 49 y.o. male with PMH of hypertension, diabetes mellitus, tobacco abuse, migraine headaches, depression, anxiety, who presents with left hip pain after fall.  Pt is transferred from [facility] per EDP, Dr. Marvis Moeller as recommended by orthopedic surgeon, Dr. Luiz Blare.  Patient reports that he has chronic right leg pain due to history of injury. He is using spinal cord stimulator currently. He states that he fell off 2 steps in his home front port accidentally, and developed severe pain over left hip. He does not have numbness or tingling is in left leg. Patient does not have chest pain, shortness of breath, cough, abdominal pain, diarrhea, symptoms of UTI.  In ED, patient was found to have comminuted intertrochanteric femur fracture on the left varus angulation at the fracture site by X-ray. No lab was done yet. Orthopedic surgeon was consulted by ED.  Hospital Course:  left intertrochanteric femur fracture PT: home health WBAT in the LLE ASA  daily for dvt prophylaxis  Chronic pain -has contract with pain clinic  Procedures: INTRAMEDULLARY (IM) NAIL INTERTROCHANTRIC  Consultations:  ortho  Discharge Exam: Filed Vitals:   02/26/15 1500  BP: 121/60  Pulse: 88  Temp: 98.1 F (36.7 C)   Resp: 20      Discharge Instructions   Discharge Instructions    Diet - low sodium heart healthy    Complete by:  As directed      Diet Carb Modified    Complete by:  As directed      Increase activity slowly    Complete by:  As directed      Weight bearing as tolerated    Complete by:  As directed   Laterality:  left  Extremity:  Lower          Current Discharge Medication List    START taking these medications   Details  aspirin 325 MG tablet Take 1 tablet (325 mg total) by mouth daily. Qty: 30 tablet, Refills: 0    docusate sodium (COLACE) 100 MG capsule Take 1 capsule (100 mg total) by mouth 2 (two) times daily. Qty: 10 capsule, Refills: 0      CONTINUE these medications which have NOT CHANGED   Details  Diclofenac Potassium (ZIPSOR) 25 MG CAPS Take 25 mg by mouth 2 (two) times a week.    lidocaine (LIDODERM) 5 % Place 1 patch onto the skin daily as needed (for pain). Remove & Discard patch within 12 hours or as directed by MD    lisinopril (PRINIVIL,ZESTRIL) 10 MG tablet Take 10 mg by mouth daily.    LORazepam (ATIVAN) 2 MG tablet Take 1-2 mg by mouth 3 (three) times daily.     metFORMIN (GLUCOPHAGE) 500 MG tablet Take 500 mg by mouth daily as needed (for sugar).  Multiple Vitamins-Minerals (MULTIVITAMIN WITH MINERALS) tablet Take 1 tablet by mouth daily.    omeprazole (PRILOSEC) 40 MG capsule Take 40 mg by mouth daily.     oxyCODONE-acetaminophen (PERCOCET) 10-325 MG per tablet Take 1 tablet by mouth 5 (five) times daily.     topiramate (TOPAMAX) 100 MG tablet Take 100 mg by mouth 2 (two) times daily.    Naloxone HCl 0.4 MG/0.4ML SOAJ Inject 0.4 mg as directed once. Incase of overdose      STOP taking these medications     sucralfate (CARAFATE) 1 G tablet        Allergies  Allergen Reactions  . Methadone Hcl Other (See Comments)    REACTION: ? Overdose   Follow-up Information    Follow up with MURPHY, TIMOTHY D, MD In 10 days.    Specialty:  Orthopedic Surgery   Contact information:   [address] [place] [address] [phone]       Follow up with [facility].   Why:  They will contact you to schedule home therapy visits.   Contact information:   [address] [place] [address] [phone]       Follow up with [name], MD In 1 week.   Specialty:  Internal Medicine   Contact information:   [address] [place] [address] [phone]        The results of significant diagnostics from this hospitalization (including imaging, microbiology, ancillary and laboratory) are listed below for reference.    Significant Diagnostic Studies: Pelvis Portable  02/25/2015   CLINICAL DATA:  Status post ORIF of proximal left femoral fracture  EXAM: PORTABLE PELVIS 1-2 VIEWS  COMPARISON:  Intraoperative film from the same day  FINDINGS: Medullary rod and proximal fixation screw are noted in satisfactory position. The fracture fragments are in near anatomic alignment. The pelvic ring is intact. A spinal stimulator is noted.  IMPRESSION: Status post ORIF of proximal left femoral fracture.   Electronically Signed   By: Alcide Clever M.D.   On: 02/25/2015 17:14   Dg Pelvis Portable  02/25/2015   CLINICAL DATA:  49 year old male with hip pain after a fall. Transferred from [facility] with intertrochanteric femur fracture. Initial encounter.  EXAM: PORTABLE PELVIS 1-2 VIEWS  COMPARISON:  [facility] left hip series 56433.  FINDINGS: Portable AP view of the pelvis at 0845 hrs. Comminuted proximal left femur intertrochanteric fracture appears stable in configuration since yesterday. Pelvis appears intact. Spinal stimulator type generator device projects over the left lumbosacral junction. Proximal right femur appears grossly intact.  IMPRESSION: Stable comminuted left femur intertrochanteric fracture since yesterday.   Electronically Signed   By: Odessa Fleming  M.D.   On: 02/25/2015 09:11   Dg C-arm 1-60 Min  02/25/2015   CLINICAL DATA:  ORIF left hip  EXAM: DG C-ARM 61-120 MIN; LEFT FEMUR 2 VIEWS  COMPARISON:  None  FLUOROSCOPY TIME:  1 minutes 21 seconds  Four images  FINDINGS: Interval placement of a left femoral IM nail with an interlocking left femoral neck screw transfixing an intertrochanteric fracture. No dislocation.  IMPRESSION: ORIF left intertrochanteric fracture.   Electronically Signed   By: Elige Ko   On: 02/25/2015 16:31   Dg Femur Min 2 Views Left  02/25/2015   CLINICAL DATA:  ORIF left hip  EXAM: DG C-ARM 61-120 MIN; LEFT FEMUR 2 VIEWS  COMPARISON:  None  FLUOROSCOPY TIME:  1 minutes 21 seconds  Four images  FINDINGS: Interval placement of a left femoral IM nail with an interlocking left femoral neck screw transfixing an intertrochanteric fracture. No dislocation.  IMPRESSION: ORIF left intertrochanteric fracture.   Electronically Signed   By: Elige Ko   On: 02/25/2015 16:31    Microbiology: Recent Results (from the past 240 hour(s))  Surgical pcr screen     Status: None   Collection Time: 02/25/15  5:42 AM  Result Value Ref Range Status   MRSA, PCR NEGATIVE NEGATIVE Final   Staphylococcus aureus NEGATIVE NEGATIVE Final    Comment:        The Xpert SA Assay (FDA approved for NASAL specimens in patients over 27 years of age), is one component of a comprehensive surveillance program.  Test performance has been validated by [facility] for patients greater than or equal to 62 year old. It is not intended to diagnose infection nor to guide or monitor treatment.      Labs: Basic Metabolic Panel:  Recent Labs Lab 02/25/15 0147 02/25/15 0603 02/26/15 0501  NA 135 132* 134*  K 3.6 3.5 3.8  CL 107 103 103  CO2 22 23 21*  GLUCOSE 98 104* 139*  BUN 12 10 8   CREATININE 0.67 0.69 0.69  CALCIUM 8.5* 8.2* 8.3*   Liver Function Tests:  Recent Labs Lab 02/25/15 0147  AST 16  ALT 12*  ALKPHOS 51  BILITOT 0.5   PROT 5.6*  ALBUMIN 3.4*   No results for input(s): LIPASE, AMYLASE in the last 168 hours. No results for input(s): AMMONIA in the last 168 hours. CBC:  Recent Labs Lab 02/25/15 0147 02/25/15 0603 02/26/15 0501  WBC 11.9* 10.8* 10.1  NEUTROABS 8.0*  --   --   HGB 11.4* 11.1* 10.1*  HCT 34.1* 32.9* 30.0*  MCV 91.2 91.9 90.9  PLT 234 221 219   Cardiac Enzymes: No results for input(s): CKTOTAL, CKMB, CKMBINDEX, TROPONINI in the last 168 hours. BNP: BNP (last 3 results) No results for input(s): BNP in the last 8760 hours.  ProBNP (last 3 results) No results for input(s): PROBNP in the last 8760 hours.  CBG:  Recent Labs Lab 02/25/15 1644 02/25/15 2156 02/26/15 0606 02/26/15 1141 02/26/15 1639  GLUCAP 75 177* 142* 126* 166*       Signed:  Zyrah Wiswell  [facility] 02/26/2015, 6:00 PM

## 2015-02-26 NOTE — Clinical Social Work Note (Signed)
CSW consult acknowledged:  Clinical Child psychotherapist received consult for SNF placement. PT currently recommending Home Health.  Clinical Social Worker will sign off for now as social work intervention is no longer needed. Please consult [place] again if new need arises.  Derenda Fennel, MSW, LCSWA [phone] 02/26/2015 5:43 PM

## 2015-02-26 NOTE — Progress Notes (Signed)
     Subjective:  POD#1 IM nail of the L hip. Patient reports pain as moderate.  Resting comfortably in bed this morning. Will see how the patient does with mobilization with PT today. Expect that from ortho standpoint ok for discharge either today or tomorrow.  Will continue to get pain medication through his chronic pain clinic.  Objective:   VITALS:   Filed Vitals:   02/25/15 1748 02/25/15 1810 02/25/15 2200 02/26/15 0549  BP:  113/50 117/50   Pulse: 88 84 84 97  Temp: 97.6 F (36.4 C) 97.9 F (36.6 C) 98 F (36.7 C) 98.4 F (36.9 C)  TempSrc:  Oral    Resp: Height:      Weight:      SpO2: 100% 100% 100% 98%    Neurologically intact ABD soft Neurovascular intact Sensation intact distally Intact pulses distally Dorsiflexion/Plantar flexion intact Incision: dressing C/D/I   Lab Results  Component Value Date   WBC 10.8* 02/25/2015   HGB 11.1* 02/25/2015   HCT 32.9* 02/25/2015   MCV 91.9 02/25/2015   PLT 221 02/25/2015   BMET    Component Value Date/Time   NA 132* 02/25/2015 0603   K 3.5 02/25/2015 0603   CL 103 02/25/2015 0603   CO2 23 02/25/2015 0603   GLUCOSE 104* 02/25/2015 0603   GLUCOSE 89 03/15/2006 1716   BUN 10 02/25/2015 0603   CREATININE 0.69 02/25/2015 0603   CALCIUM 8.2* 02/25/2015 0603   GFRNONAA >60 02/25/2015 0603   GFRAA >60 02/25/2015 0603     Assessment/Plan: 1 Day Post-Op   Principal Problem:   Intertrochanteric fracture of left hip (HCC) Active Problems:   Anxiety state   Depression   Essential hypertension   GERD   Migraine   Hip fracture (HCC)   Diabetes mellitus without complication (HCC)   Up with therapy WBAT in the LLE ASA  daily for dvt prophylaxis Ok from ortho standpoint to discharge home once mobilizing well with PT   Marguarite Markov Marie 02/26/2015, 6:10 AM Cell [phone]

## 2015-10-13 ENCOUNTER — Other Ambulatory Visit: Payer: Self-pay | Admitting: Physical Medicine and Rehabilitation

## 2015-10-13 ENCOUNTER — Ambulatory Visit
Admission: RE | Admit: 2015-10-13 | Discharge: 2015-10-13 | Disposition: A | Discharge status: Home / Self Care | Payer: Disability Insurance | Source: Ambulatory Visit | Attending: Physical Medicine and Rehabilitation | Admitting: Physical Medicine and Rehabilitation

## 2015-10-13 DIAGNOSIS — Z9889 Other specified postprocedural states: Secondary | ICD-10-CM | POA: Insufficient documentation

## 2015-10-13 DIAGNOSIS — I10 Essential (primary) hypertension: Secondary | ICD-10-CM | POA: Diagnosis not present

## 2015-10-13 DIAGNOSIS — F419 Anxiety disorder, unspecified: Secondary | ICD-10-CM | POA: Diagnosis not present

## 2015-10-13 DIAGNOSIS — F329 Major depressive disorder, single episode, unspecified: Secondary | ICD-10-CM | POA: Diagnosis not present

## 2015-10-13 DIAGNOSIS — Z8739 Personal history of other diseases of the musculoskeletal system and connective tissue: Secondary | ICD-10-CM

## 2015-10-13 DIAGNOSIS — Z969 Presence of functional implant, unspecified: Secondary | ICD-10-CM | POA: Diagnosis not present

## 2015-10-13 DIAGNOSIS — E119 Type 2 diabetes mellitus without complications: Secondary | ICD-10-CM | POA: Insufficient documentation

## 2019-09-19 ENCOUNTER — Encounter: Payer: Self-pay | Admitting: Gastroenterology

## 2019-10-01 ENCOUNTER — Encounter: Payer: Self-pay | Admitting: Gastroenterology

## 2019-10-01 ENCOUNTER — Ambulatory Visit (INDEPENDENT_AMBULATORY_CARE_PROVIDER_SITE_OTHER): Payer: Medicare Other | Admitting: Gastroenterology

## 2019-10-01 ENCOUNTER — Other Ambulatory Visit (INDEPENDENT_AMBULATORY_CARE_PROVIDER_SITE_OTHER): Payer: Medicare Other

## 2019-10-01 ENCOUNTER — Other Ambulatory Visit: Payer: Self-pay

## 2019-10-01 VITALS — BP 120/76 | HR 86 | Temp 98.0°F | Ht 72.0 in | Wt 213.0 lb

## 2019-10-01 DIAGNOSIS — R112 Nausea with vomiting, unspecified: Secondary | ICD-10-CM

## 2019-10-01 DIAGNOSIS — R109 Unspecified abdominal pain: Secondary | ICD-10-CM

## 2019-10-01 DIAGNOSIS — R197 Diarrhea, unspecified: Secondary | ICD-10-CM

## 2019-10-01 LAB — COMPREHENSIVE METABOLIC PANEL
ALT: 16 U/L (ref 0–53)
AST: 14 U/L (ref 0–37)
Albumin: 4.4 g/dL (ref 3.5–5.2)
Alkaline Phosphatase: 90 U/L (ref 39–117)
BUN: 10 mg/dL (ref 6–23)
CO2: 22 mEq/L (ref 19–32)
Calcium: 9.2 mg/dL (ref 8.4–10.5)
Chloride: 107 mEq/L (ref 96–112)
Creatinine, Ser: 0.8 mg/dL (ref 0.40–1.50)
GFR: 99.92 mL/min (ref >60.00)
Glucose, Bld: 122 mg/dL — ABNORMAL HIGH (ref 70–99)
Potassium: 3.5 mEq/L (ref 3.5–5.1)
Sodium: 138 mEq/L (ref 135–145)
Total Bilirubin: 0.3 mg/dL (ref 0.2–1.2)
Total Protein: 7 g/dL (ref 6.0–8.3)

## 2019-10-01 LAB — TSH: TSH: 2.19 u[IU]/mL (ref 0.35–4.50)

## 2019-10-01 LAB — CBC WITH DIFFERENTIAL/PLATELET
Basophils Absolute: 0.1 10*3/uL (ref 0.0–0.1)
Basophils Relative: 1.1 % (ref 0.0–3.0)
Eosinophils Absolute: 0.1 10*3/uL (ref 0.0–0.7)
Eosinophils Relative: 0.9 % (ref 0.0–5.0)
HCT: 43.4 % (ref 39.0–52.0)
Hemoglobin: 14.8 g/dL (ref 13.0–17.0)
Lymphocytes Relative: 32.2 % (ref 12.0–46.0)
Lymphs Abs: 3.3 10*3/uL (ref 0.7–4.0)
MCHC: 34 g/dL (ref 30.0–36.0)
MCV: 88.7 fl (ref 78.0–100.0)
Monocytes Absolute: 0.6 10*3/uL (ref 0.1–1.0)
Monocytes Relative: 6.3 % (ref 3.0–12.0)
Neutro Abs: 6 10*3/uL (ref 1.4–7.7)
Neutrophils Relative %: 59.5 % (ref 43.0–77.0)
Platelets: 330 10*3/uL (ref 150.0–400.0)
RBC: 4.89 Mil/uL (ref 4.22–5.81)
RDW: 13.3 % (ref 11.5–15.5)
WBC: 10.1 10*3/uL (ref 4.0–10.5)

## 2019-10-01 LAB — LIPID PANEL
Cholesterol: 176 mg/dL (ref 0–200)
HDL: 35.5 mg/dL — ABNORMAL LOW (ref >39.00)
LDL Cholesterol: 111 mg/dL — ABNORMAL HIGH (ref 0–99)
NonHDL: 140.91
Total CHOL/HDL Ratio: 5
Triglycerides: 149 mg/dL (ref 0.0–149.0)
VLDL: 29.8 mg/dL (ref 0.0–40.0)

## 2019-10-01 LAB — LIPASE: Lipase: 29 U/L (ref 11.0–59.0)

## 2019-10-01 MED ORDER — ONDANSETRON HCL 4 MG PO TABS: 4.0000 mg | ORAL_TABLET | Freq: Four times a day (QID) | ORAL | 2 refills | Status: DC | PRN

## 2019-10-01 NOTE — Progress Notes (Signed)
10/01/2019 Peter Zimmerman [id number] 11/26/1965   HISTORY OF PRESENT ILLNESS: This is a 54 year old male who is new to our office.  He presents here today as a self-referral for complaints of nausea, vomiting, abdominal pain.  He tells me that for the past 2 weeks everything he eats he either has vomiting or diarrhea.  He says that he has diarrhea about 3-4 times per day mostly like water.  He says the vomiting usually occurs within about 45 minutes of eating and occurs almost every time that he eats.  He reports left-sided, left mid, abdominal pain.  Seems to be just to the left and just superior to the umbilicus.  He denies seeing blood in his stool.  He denies fever or chills per se.  He says he is worried about infection because he has some teeth that are abscessed and he is waiting to try to get all of his teeth extracted.  He denies taking NSAIDs.  He says he has tried taking probiotics.  12/2014 EGD was normal.  Small bowel biopsies negative for celiac. Colonoscopy at the same time showed two 2 mm sigmoid polyps that were removed and were hyperplastic polyps, medium internal hemorrhoids.  10 cm of TI was intubated and was normal.   Past Medical History:  Diagnosis Date  . Anxiety   . Chronic pain syndrome   . Depression   . Diabetes mellitus   . GERD (gastroesophageal reflux disease)   . Hyperlipidemia   . Hypertension   . Migraine 2005  . Suicidal ideation    history off   Past Surgical History:  Procedure Laterality Date  . INTRAMEDULLARY (IM) NAIL INTERTROCHANTERIC Left 02/25/2015   Procedure: INTRAMEDULLARY (IM) NAIL INTERTROCHANTRIC;  Surgeon: Renette Butters, MD;  Location: [place];  Service: Orthopedics;  Laterality: Left;  . KNEE ARTHROSCOPY Right 03/02/2014   Procedure: RIGHT KNEE ARTHROSCOPY;  Surgeon: Vickey Huger, MD;  Location: [facility];  Service: Orthopedics;  Laterality: Right;  . Right tibial surgery     x 3    reports that he has been smoking. He does not  have any smokeless tobacco history on file. He reports that he does not drink alcohol or use drugs. family history includes Hypertension in an other family member. Allergies  Allergen Reactions  . Methadone Hcl Other (See Comments)    REACTION: ? Overdose      Outpatient Encounter Medications as of 10/01/2019  Medication Sig  . ascorbic acid (VITAMIN C) 500 MG tablet Take 500 mg by mouth daily.  . Diclofenac Potassium (ZIPSOR) 25 MG CAPS Take 25 mg by mouth daily as needed.   . docusate sodium (COLACE) 100 MG capsule Take 1 capsule (100 mg total) by mouth 2 (two) times daily.  Marland Kitchen lisinopril (PRINIVIL,ZESTRIL) 10 MG tablet Take 10 mg by mouth daily.  . Multiple Vitamins-Minerals (MULTIVITAMIN WITH MINERALS) tablet Take 1 tablet by mouth daily.  . Naloxone HCl 0.4 MG/0.4ML SOAJ Inject 0.4 mg as directed once. Incase of overdose  . omeprazole (PRILOSEC) 40 MG capsule Take 40 mg by mouth daily.   Marland Kitchen oxyCODONE-acetaminophen (PERCOCET) 10-325 MG per tablet Take 1 tablet by mouth 5 (five) times daily.   Marland Kitchen topiramate (TOPAMAX) 100 MG tablet Take 100 mg by mouth 2 (two) times daily.  . [DISCONTINUED] aspirin 325 MG tablet Take 1 tablet (325 mg total) by mouth daily.  . [DISCONTINUED] lidocaine (LIDODERM) 5 % Place 1 patch onto the skin daily as needed (for pain).  Remove & Discard patch within 12 hours or as directed by MD  . [DISCONTINUED] LORazepam (ATIVAN) 2 MG tablet Take 1-2 mg by mouth 3 (three) times daily.   . [DISCONTINUED] metFORMIN (GLUCOPHAGE) 500 MG tablet Take 500 mg by mouth daily as needed (for sugar).    No facility-administered encounter medications on file as of 10/01/2019.     REVIEW OF SYSTEMS  : All other systems reviewed and negative except where noted in the History of Present Illness.   PHYSICAL EXAM: BP 120/76   Pulse 86   Temp 98 F (36.7 C)   Ht 6' (1.829 m)   Wt 213 lb (96.6 kg)   BMI 28.89 kg/m  General: Well developed white male in no acute distress; does not  appear acutely ill. Head: Normocephalic and atraumatic Eyes:  Sclerae anicteric, conjunctiva pink. Ears: Normal auditory acuity Lungs: Clear throughout to auscultation; no increased WOB. Heart: Regular rate and rhythm; no M/R/G. Abdomen: Soft, non-distended.  BS present.  Mild left mid-abdominal TTP. Musculoskeletal: Symmetrical with no gross deformities  Skin: No lesions on visible extremities Extremities: No edema  Neurological: Alert oriented x 4, grossly non-focal Psychological:  Alert and cooperative. Normal mood and affect  ASSESSMENT AND PLAN: *54 year old male with complaints of 2 weeks of nausea, vomiting, and left-sided (left mid) abdominal pain. Prior to that reports feeling fine. Question prolonged infectious source versus other causes. Will check labs including CBC, CMP, TSH, lipase, and lipid panel (specifically at the patient request). We will also check stool studies and will plan for CT scan of the abdomen and pelvis with contrast. I sent a prescription of Zofran to be used as needed for nausea. Advised that he can take this before each meal to see if it helps prevent the vomiting episodes. Other plans pending results of the above.  Discussed bland diet and staying well-hydrated.  CC:  [name], MD

## 2019-10-01 NOTE — Progress Notes (Signed)
I agree with the above note, plan 

## 2019-10-01 NOTE — Patient Instructions (Signed)
Your provider has requested that you go to the basement level for lab work before leaving today. Press "B" on the elevator. The lab is located at the first door on the left as you exit the elevator.  We have sent the following medications to your pharmacy for you to pick up at your convenience: Zofran   You may also try Imodium or Pepto-Bismol over the counter to help with Diarrhea.   We recommend you try a bland diet and stay well hydrated.    You have been scheduled for a CT scan of the abdomen and pelvis at [facility], 1st floor Radiology.  You are scheduled on 10/07/19 at 10:30am. You should arrive 15 minutes prior to your appointment time for registration.   Please go to [facility] Radiology at least 3 days prior to your procedure to pick up instructions and contrast for the exam.  WARNING: IF YOU ARE ALLERGIC TO IODINE/X-RAY DYE, PLEASE NOTIFY RADIOLOGY IMMEDIATELY AT [phone]! YOU WILL BE GIVEN A 13 HOUR PREMEDICATION PREP.   If you have any questions regarding your exam or if you need to reschedule, you may call [phone] between the hours of 8:00 am and 5:00 pm, Monday-Friday.   If you are age 15 or older, your body mass index should be between 23-30. Your Body mass index is 28.89 kg/m. If this is out of the aforementioned range listed, please consider follow up with your Primary Care Provider.  If you are age 56 or younger, your body mass index should be between 19-25. Your Body mass index is 28.89 kg/m. If this is out of the aformentioned range listed, please consider follow up with your Primary Care Provider.    Due to recent changes in healthcare laws, you may see the results of your imaging and laboratory studies on MyChart before your provider has had a chance to review them.  We understand that in some cases there may be results that are confusing or concerning to you. Not all laboratory results come back in the same time frame and the provider may be  waiting for multiple results in order to interpret others.  Please give [place] 48 hours in order for your provider to thoroughly review all the results before contacting the office for clarification of your results.   Thank you for choosing me and [facility].  Shanda Bumps Zehr-PA

## 2019-10-07 ENCOUNTER — Other Ambulatory Visit: Payer: Self-pay

## 2019-10-07 ENCOUNTER — Other Ambulatory Visit: Payer: Medicare Other

## 2019-10-07 ENCOUNTER — Ambulatory Visit (HOSPITAL_COMMUNITY)
Admission: RE | Admit: 2019-10-07 | Discharge: 2019-10-07 | Disposition: A | Discharge status: Home / Self Care | Payer: Medicare Other | Source: Ambulatory Visit | Attending: Gastroenterology | Admitting: Gastroenterology

## 2019-10-07 DIAGNOSIS — R109 Unspecified abdominal pain: Secondary | ICD-10-CM | POA: Insufficient documentation

## 2019-10-07 DIAGNOSIS — R112 Nausea with vomiting, unspecified: Secondary | ICD-10-CM | POA: Insufficient documentation

## 2019-10-07 DIAGNOSIS — R197 Diarrhea, unspecified: Secondary | ICD-10-CM | POA: Diagnosis present

## 2019-10-07 MED ORDER — IOHEXOL 300 MG/ML  SOLN
100.0000 mL | Freq: Once | INTRAMUSCULAR | Status: AC | PRN
  Administered 2019-10-07: 100 mL via INTRAVENOUS

## 2019-10-07 MED ORDER — SODIUM CHLORIDE (PF) 0.9 % IJ SOLN
INTRAMUSCULAR | Status: AC
  Filled 2019-10-07: qty 50

## 2019-10-08 LAB — CLOSTRIDIUM DIFFICILE TOXIN B, QUALITATIVE, REAL-TIME PCR: Toxigenic C. Difficile by PCR: NOT DETECTED

## 2019-10-09 LAB — GI PROFILE, STOOL, PCR

## 2019-10-16 ENCOUNTER — Emergency Department (HOSPITAL_COMMUNITY): Payer: Medicare Other

## 2019-10-16 ENCOUNTER — Encounter (HOSPITAL_COMMUNITY): Payer: Self-pay

## 2019-10-16 ENCOUNTER — Other Ambulatory Visit: Payer: Self-pay

## 2019-10-16 ENCOUNTER — Emergency Department (HOSPITAL_COMMUNITY)
Admission: EM | Admit: 2019-10-16 | Discharge: 2019-10-16 | Disposition: A | Discharge status: Home / Self Care | Payer: Medicare Other | Attending: Emergency Medicine | Admitting: Emergency Medicine

## 2019-10-16 DIAGNOSIS — Y9389 Activity, other specified: Secondary | ICD-10-CM | POA: Insufficient documentation

## 2019-10-16 DIAGNOSIS — Z5321 Procedure and treatment not carried out due to patient leaving prior to being seen by health care provider: Secondary | ICD-10-CM | POA: Diagnosis not present

## 2019-10-16 DIAGNOSIS — W1789XA Other fall from one level to another, initial encounter: Secondary | ICD-10-CM | POA: Diagnosis not present

## 2019-10-16 DIAGNOSIS — Y999 Unspecified external cause status: Secondary | ICD-10-CM | POA: Insufficient documentation

## 2019-10-16 DIAGNOSIS — S6992XA Unspecified injury of left wrist, hand and finger(s), initial encounter: Secondary | ICD-10-CM | POA: Diagnosis not present

## 2019-10-16 DIAGNOSIS — Y929 Unspecified place or not applicable: Secondary | ICD-10-CM | POA: Diagnosis not present

## 2019-10-16 NOTE — ED Triage Notes (Signed)
Patient states that he was getting off of the tailgate of his truck and fell and landing on his left wrist.. Patient does have deformity.

## 2019-10-17 ENCOUNTER — Emergency Department (HOSPITAL_COMMUNITY): Payer: Medicare Other

## 2019-10-17 ENCOUNTER — Encounter (HOSPITAL_COMMUNITY): Payer: Self-pay | Admitting: Emergency Medicine

## 2019-10-17 ENCOUNTER — Emergency Department (HOSPITAL_COMMUNITY)
Admission: EM | Admit: 2019-10-17 | Discharge: 2019-10-17 | Disposition: A | Discharge status: Home / Self Care | Payer: Medicare Other | Attending: Emergency Medicine | Admitting: Emergency Medicine

## 2019-10-17 ENCOUNTER — Other Ambulatory Visit: Payer: Self-pay

## 2019-10-17 DIAGNOSIS — S59912A Unspecified injury of left forearm, initial encounter: Secondary | ICD-10-CM | POA: Diagnosis present

## 2019-10-17 DIAGNOSIS — S52612A Displaced fracture of left ulna styloid process, initial encounter for closed fracture: Secondary | ICD-10-CM | POA: Diagnosis not present

## 2019-10-17 DIAGNOSIS — Y9389 Activity, other specified: Secondary | ICD-10-CM | POA: Diagnosis not present

## 2019-10-17 DIAGNOSIS — F17210 Nicotine dependence, cigarettes, uncomplicated: Secondary | ICD-10-CM | POA: Diagnosis not present

## 2019-10-17 DIAGNOSIS — X58XXXA Exposure to other specified factors, initial encounter: Secondary | ICD-10-CM | POA: Diagnosis not present

## 2019-10-17 DIAGNOSIS — Z79899 Other long term (current) drug therapy: Secondary | ICD-10-CM | POA: Insufficient documentation

## 2019-10-17 DIAGNOSIS — I10 Essential (primary) hypertension: Secondary | ICD-10-CM | POA: Diagnosis not present

## 2019-10-17 DIAGNOSIS — S52572A Other intraarticular fracture of lower end of left radius, initial encounter for closed fracture: Secondary | ICD-10-CM | POA: Diagnosis not present

## 2019-10-17 DIAGNOSIS — Y998 Other external cause status: Secondary | ICD-10-CM | POA: Diagnosis not present

## 2019-10-17 DIAGNOSIS — E119 Type 2 diabetes mellitus without complications: Secondary | ICD-10-CM | POA: Insufficient documentation

## 2019-10-17 DIAGNOSIS — S62102A Fracture of unspecified carpal bone, left wrist, initial encounter for closed fracture: Secondary | ICD-10-CM

## 2019-10-17 DIAGNOSIS — Y92812 Truck as the place of occurrence of the external cause: Secondary | ICD-10-CM | POA: Diagnosis not present

## 2019-10-17 HISTORY — DX: Fracture of unspecified carpal bone, left wrist, initial encounter for closed fracture: S62.102A

## 2019-10-17 MED ORDER — ONDANSETRON HCL 4 MG PO TABS
4.0000 mg | ORAL_TABLET | Freq: Once | ORAL | Status: AC
  Administered 2019-10-17: 4 mg via ORAL
  Filled 2019-10-17: qty 1

## 2019-10-17 MED ORDER — OXYCODONE-ACETAMINOPHEN 5-325 MG PO TABS
2.0000 | ORAL_TABLET | Freq: Once | ORAL | Status: AC
  Administered 2019-10-17: 2 via ORAL
  Filled 2019-10-17: qty 2

## 2019-10-17 MED ORDER — OXYCODONE-ACETAMINOPHEN 10-325 MG PO TABS: 1.0000 | ORAL_TABLET | Freq: Every day | ORAL | 0 refills | Status: AC

## 2019-10-17 NOTE — ED Provider Notes (Signed)
[facility]-EMERGENCY DEPT Provider Note   CSN: [id number] Arrival date & time: 10/17/19  7673     History Chief Complaint  Patient presents with  . Arm Injury    left    KEY CEN is a 54 y.o. right-hand-dominant male with past medical history significant for chronic pain syndrome, hyperlipidemia hypertension, migraine presenting to emergency department today with chief complaint of left arm injury.  Patient states he was sitting on his truck tailgate yesterday and when he jumped off he accidentally landed on his outstretched left hand.  He had immediate pain and knew the wrist was broken.  He denies hitting his head or loss of consciousness.  He came to the emergency department and had an x-ray performed in triage however left without seeing a provider because of the prolonged wait time. He is back in the emergency department today with progressively worsening left arm pain.  He states the pain is constant and is worse with any kind of movement.  He states the pain is located in his left wrist and radiates up his forearm.  He has a prescription for Percocet for his chronic pain and states he took that yesterday without any symptom improvement.  He rates his pain 10 of 10 in severity.  He denies any numbness, tingling or decreased sensation in left hand.  Patient is not anticoagulated.  He admits he has history of broken wrist approximately 30 years ago that was treated with casting.  He has had multiple other orthopedic surgeries on bilateral lower extremities with Dr. Eulah Pont and Dr. Sherlean Foot.      Past Medical History:  Diagnosis Date  . Anxiety   . Chronic pain syndrome   . Depression   . Diabetes mellitus   . GERD (gastroesophageal reflux disease)   . Hyperlipidemia   . Hypertension   . Migraine 2005  . Suicidal ideation    history off    Patient Active Problem List   Diagnosis Date Noted  . Nausea and vomiting 10/01/2019  . Diarrhea 10/01/2019    . Left sided abdominal pain 10/01/2019  . Intertrochanteric fracture of left hip (HCC) 02/25/2015  . Hip fracture (HCC) 02/25/2015  . Diabetes mellitus without complication (HCC)   . Headache(784.0) 03/15/2011  . Migraine 03/15/2011  . CORNS AND CALLOSITIES 07/04/2010  . SKIN LESION 07/04/2010  . FIBROMYALGIA 07/04/2010  . FATIGUE 07/04/2010  . DIABETES MELLITUS, TYPE II 03/12/2007  . HYPERLIPIDEMIA 03/12/2007  . Anxiety state 03/12/2007  . Depression 03/12/2007  . Essential hypertension 03/12/2007  . GERD 03/12/2007  . SYMPTOM, INSOMNIA NOS 03/12/2007    Past Surgical History:  Procedure Laterality Date  . INTRAMEDULLARY (IM) NAIL INTERTROCHANTERIC Left 02/25/2015   Procedure: INTRAMEDULLARY (IM) NAIL INTERTROCHANTRIC;  Surgeon: Sheral Apley, MD;  Location: [facility];  Service: Orthopedics;  Laterality: Left;  . KNEE ARTHROSCOPY Right 03/02/2014   Procedure: RIGHT KNEE ARTHROSCOPY;  Surgeon: Dannielle Huh, MD;  Location: [facility];  Service: Orthopedics;  Laterality: Right;  . Right tibial surgery     x 3       Family History  Problem Relation Age of Onset  . Hypertension Other   . Cancer Mother   . COPD Mother   . Scoliosis Mother   . Heart failure Father     Social History   Tobacco Use  . Smoking status: Current Every Day Smoker    Packs/day: 0.50    Types: Cigarettes  . Smokeless tobacco: Never Used  Substance Use Topics  . Alcohol use: No  . Drug use: No    Home Medications Prior to Admission medications   Medication Sig Start Date End Date Taking? Authorizing Provider  ascorbic acid (VITAMIN C) 500 MG tablet Take 500 mg by mouth daily.    [provider]  Diclofenac Potassium (ZIPSOR) 25 MG CAPS Take 25 mg by mouth daily as needed.     [provider]  docusate sodium (COLACE) 100 MG capsule Take 1 capsule (100 mg total) by mouth 2 (two) times daily. 02/25/15   Lovett Calender, PA-C  lisinopril (PRINIVIL,ZESTRIL) 10 MG tablet Take 10 mg by  mouth daily.    [provider]  Multiple Vitamins-Minerals (MULTIVITAMIN WITH MINERALS) tablet Take 1 tablet by mouth daily.    [provider]  Naloxone HCl 0.4 MG/0.4ML SOAJ Inject 0.4 mg as directed once. Incase of overdose    [provider]  omeprazole (PRILOSEC) 40 MG capsule Take 40 mg by mouth daily.     [provider]  ondansetron (ZOFRAN) 4 MG tablet Take 1 tablet (4 mg total) by mouth every 6 (six) hours as needed for nausea or vomiting. 10/01/19   Zehr, Laban Emperor, PA-C  oxyCODONE-acetaminophen (PERCOCET) 10-325 MG tablet Take 1 tablet by mouth 5 (five) times daily for 3 days. 10/17/19 10/20/19  Jaimen Melone E, PA-C  topiramate (TOPAMAX) 100 MG tablet Take 100 mg by mouth 2 (two) times daily.    [provider]    Allergies    Methadone hcl  Review of Systems   Review of Systems All other systems are reviewed and are negative for acute change except as noted in the HPI.  Physical Exam Updated Vital Signs BP 118/76 (BP Location: Right Arm)   Pulse 79   Temp 98.1 F (36.7 C) (Oral)   Resp 17   SpO2 98%   Physical Exam Vitals and nursing note reviewed.  Constitutional:      Appearance: He is well-developed. He is not ill-appearing or toxic-appearing.  HENT:     Head: Normocephalic and atraumatic.     Nose: Nose normal.  Eyes:     General: No scleral icterus.       Right eye: No discharge.        Left eye: No discharge.     Conjunctiva/sclera: Conjunctivae normal.  Neck:     Vascular: No JVD.  Cardiovascular:     Rate and Rhythm: Normal rate and regular rhythm.     Pulses: Normal pulses.          Radial pulses are 2+ on the right side.     Heart sounds: Normal heart sounds.  Pulmonary:     Effort: Pulmonary effort is normal.     Breath sounds: Normal breath sounds.  Abdominal:     General: There is no distension.  Musculoskeletal:     Left elbow: Normal.     Left wrist: Swelling, deformity and bony tenderness  present. No snuff box tenderness or crepitus. Decreased range of motion.     Left hand: Normal capillary refill.     Cervical back: Normal range of motion.     Comments: Obvious closed deformity to left wrist. Neurovascularly intact distally. Compartments soft above and below affected joint.    Skin:    General: Skin is warm and dry.  Neurological:     Mental Status: He is oriented to person, place, and time.     GCS: GCS eye subscore is  4. GCS verbal subscore is 5. GCS motor subscore is 6.     Comments: Fluent speech, no facial droop.  Psychiatric:        Behavior: Behavior normal.     ED Results / Procedures / Treatments   Labs (all labs ordered are listed, but only abnormal results are displayed) Labs Reviewed - No data to display  EKG None  Radiology DG Forearm Left  Result Date: 10/17/2019 CLINICAL DATA:  Pain.  Known fractures of distal radius and ulna EXAM: LEFT FOREARM - 2 VIEW COMPARISON:  Left wrist radiographs Oct 16, 2019 FINDINGS: Frontal and lateral views were obtained. Comminuted fracture distal radial metaphysis is again noted with mild impaction as well as dorsal displacement of a portion of the comminuted fracture along the dorsal aspect of the distal radius. Fracture alignment is essentially stable compared to 1 day prior. There is also a fracture of the ulnar styloid, better appreciated on wrist radiographs from 1 day prior. No evident new fracture. No dislocation. No elbow joint effusion. IMPRESSION: Comminuted fracture distal radius, essentially stable in appearance compared to 1 day prior. Avulsion ulnar styloid. No new fracture. No dislocation. No appreciable arthropathic change. Electronically Signed   By: Bretta Bang III M.D.   On: 10/17/2019 13:07   DG Wrist Complete Left  Result Date: 10/16/2019 CLINICAL DATA:  Syncope, fell, left wrist pain EXAM: LEFT WRIST - COMPLETE 3+ VIEW COMPARISON:  None. FINDINGS: Frontal, oblique, and lateral views of the left  wrist are obtained as well as an ulnar deviated view. There is a comminuted intra-articular fracture of the distal left radius with impaction and dorsal angulation. Radiocarpal joint remains intact. Minimally displaced ulnar styloid fracture is also noted. No additional fractures. Prominent dorsal osteophyte off of the triquetrum. Diffuse soft tissue edema. IMPRESSION: 1. Comminuted intra-articular distal left radial fracture with dorsal angulation. 2. Minimally displaced ulnar styloid fracture. Electronically Signed   By: Sharlet Salina M.D.   On: 10/16/2019 18:43    Procedures .Ortho Injury Treatment  Date/Time: 10/17/2019 2:30 PM Performed by: Sherene Sires, PA-C Authorized by: Sherene Sires, PA-C   Consent:    Consent obtained:  Verbal   Consent given by:  Patient   Risks discussed:  Fracture   Alternatives discussed:  No treatmentInjury location: wrist Location details: left wrist Pre-procedure neurovascular assessment: neurovascularly intact Immobilization: splint and sling Splint type: sugar tong Supplies used: Ortho-Glass Post-procedure neurovascular assessment: post-procedure neurovascularly intact Patient tolerance: patient tolerated the procedure well with no immediate complications    (including critical care time)  Medications Ordered in ED Medications  oxyCODONE-acetaminophen (PERCOCET/ROXICET) 5-325 MG per tablet 2 tablet (2 tablets Oral Given 10/17/19 1216)  ondansetron (ZOFRAN) tablet 4 mg (4 mg Oral Given 10/17/19 1218)    ED Course  I have reviewed the triage vital signs and the nursing notes.  Pertinent labs & imaging results that were available during my care of the patient were reviewed by me and considered in my medical decision making (see chart for details).  Clinical Course as of Oct 16 1429  Fri Oct 17, 2019  5631 54 year old male who is right-hand presented to ED with mechanical fall off the back of his pickup truck, FOOSH type injury, found  here to have a closed displaced intra-articular fracture of the distal radius.  He is neurovascularly intact in the distal hand.  He came to the ED last night but left after a long waiting time.  He is having worsening pain.  He is on chronic pain medicine oxycodone 10 mg q6h per a pain mgmt plan.  Will discuss imaging with orthopedist   [MT]    Clinical Course User Index [MT] Trifan, Kermit Balo, MD   MDM Rules/Calculators/A&P                      History provided by patient with additional history obtained from chart review.    Patient seen and examined. Patient presents awake, alert, hemodynamically stable, afebrile, non toxic.  Patient presents to the ED with complaints of pain to the left wrist s/p injury mechanical fall. Exam without open wounds however there is an obvious deformity of left wrist. Tender to palpation of left wrist and posterior forearm. NVI distally. Xray of left wrist shows comminuted intra-articular distal left radial fracture with dorsal angulation and minimally displaced ulnar styloid fracture.  Case discussed with on-call ortho PA-C Earney Hamburg who viewed patient's imaging and is recommending a sugar tong splint with sling.  Patient will need to follow-up with orthopedics outpatient for likely surgery.  Patient wishes to follow-up with Dr. Eulah Pont as he is already an established patient there.   Patient checked after splint application and he remains neurovascularly intact.  Will discharge home with 3-day course of Percocet for pain.  I have reviewed the PDMP during this encounter.  Patient has a prescription for Percocet for chronic pain however he is likely to run out early because he has been taking this for the pain in his wrist. The patient was discussed with and seen by Dr. Renaye Rakers who agrees with the treatment plan.  [place] pharmacy called as patient has prescription for Percocet already.  We would still like him to have this short 3-day course as there is an  upcoming holiday and he is likely to again run out of his prescription early.  The patient appears reasonably screened and/or stabilized for discharge and I doubt any other medical condition or other [facility] requiring further screening, evaluation, or treatment in the ED at this time prior to discharge. The patient is safe for discharge with strict return precautions discussed.    Portions of this note were generated with Scientist, clinical (histocompatibility and immunogenetics). Dictation errors may occur despite best attempts at proofreading.   Final Clinical Impression(s) / ED Diagnoses Final diagnoses:  Other closed intra-articular fracture of distal end of left radius, initial encounter  Closed displaced fracture of styloid process of left ulna, initial encounter    Rx / DC Orders ED Discharge Orders         Ordered    oxyCODONE-acetaminophen (PERCOCET) 10-325 MG tablet  5 times daily     10/17/19 1421           Kathyrn Lass 10/17/19 1431    Terald Sleeper, MD 10/17/19 Rickey Primus

## 2019-10-17 NOTE — Discharge Instructions (Addendum)
You have been seen today for wrist pain. Please read and follow all provided instructions. Return to the emergency room for worsening condition or new concerning symptoms.    The x-ray shows you have a comminuted intra-articular distal left radial fracture with dorsal angulation and a minimally displaced ulnar styloid fracture.  1. Medications:  Prescription sent to your pharmacy for a 3-day supply of oxycodone.  This is a narcotic pain medicine for severe pain.  Please take as prescribed.  Do not drive or work when taking as it can make you drowsy.  Continue usual home medications Take medications as prescribed. Please review all of the medicines and only take them if you do not have an allergy to them.   2. Treatment: Wear the sling and sling until you follow-up with orthopedics.  3. Follow Up:  Please follow up with Dr. Eulah Pont.  Call his office to schedule next available follow-up appointment.  You will likely need surgery because of your broken bones.   ?

## 2019-10-17 NOTE — ED Triage Notes (Signed)
Pt was here yesterday for left arm injury but left after 6 hours of waiting and not being seen.

## 2019-10-23 ENCOUNTER — Encounter (HOSPITAL_BASED_OUTPATIENT_CLINIC_OR_DEPARTMENT_OTHER): Payer: Self-pay | Admitting: Orthopedic Surgery

## 2019-10-23 ENCOUNTER — Other Ambulatory Visit: Payer: Self-pay

## 2019-10-23 NOTE — Progress Notes (Signed)
Spoke w/ via phone for pre-op interview---patient Lab needs dos---- I stat 8, ekg            COVID test ------10-24-2019 at 255 pm Arrive at -------930 am 10-28-2019 No food after midnight, clear liquids from midnight until 830 am then npo Medications to take morning of surgery ----- Diabetic medication -----omeprazole, topirimate, percocet Patient Special Instructions -----none Pre-Op special Istructions -----none Patient verbalized understanding of instructions that were given at this phone interview. Patient denies shortness of breath, chest pain, fever, cough a this phone interview.

## 2019-10-24 ENCOUNTER — Other Ambulatory Visit (HOSPITAL_COMMUNITY)
Admission: RE | Admit: 2019-10-24 | Discharge: 2019-10-24 | Disposition: A | Discharge status: Home / Self Care | Payer: Medicare Other | Source: Ambulatory Visit | Attending: Orthopedic Surgery | Admitting: Orthopedic Surgery

## 2019-10-24 DIAGNOSIS — Z20822 Contact with and (suspected) exposure to covid-19: Secondary | ICD-10-CM | POA: Insufficient documentation

## 2019-10-24 DIAGNOSIS — Z01812 Encounter for preprocedural laboratory examination: Secondary | ICD-10-CM | POA: Diagnosis present

## 2019-10-25 LAB — SARS CORONAVIRUS 2 (TAT 6-24 HRS): SARS Coronavirus 2: NEGATIVE

## 2019-10-27 NOTE — H&P (Signed)
[facility] [address]   [address] [place] [address] [phone] A Division of [facility]  RE: Kieffer, Blatz                                  [phone]         DOB: 1965/07/27 INITIAL EVALUATION 10/22/2019  Reason for visit:  Left distal radius fracture.    HPI: He was working in the yard and fell of his truck onto his left outstretched hand.  He was seen in the emergency room and noted to have a significantly displaced distal radius fracture.  He was splinted and is following up with me.  He has a history of chronic pain and is in a chronic pain clinic.  He is also on disability.    OBJECTIVE: The patient is a well appearing male, in no apparent distress.  He is neurovascularly intact.  His splint is intact.  Painless passive motion.   IMAGES: I reviewed these x-rays, which showed significant ulnar displacement and shortening with intraarticular involvement.    ASSESSMENT/PLAN:  I discussed the risks and benefits of open reduction and internal fixation of his distal radius.  We will do this as an outpatient.  We will reach out to his chronic pain clinic to discuss who will be providing postoperative pain management.  I discussed the risks of this with him.  He understands and wishes to go forward.    Jewel Baize.  Eulah Pont, M.D.  Electronically verified by Jewel Baize. Eulah Pont, M.D. TDM:pmw Cc:  [facility]  fax [phone]  D 10/24/19 T 10/24/19

## 2019-10-28 ENCOUNTER — Ambulatory Visit (HOSPITAL_BASED_OUTPATIENT_CLINIC_OR_DEPARTMENT_OTHER): Payer: Medicare Other | Admitting: Certified Registered"

## 2019-10-28 ENCOUNTER — Encounter (HOSPITAL_BASED_OUTPATIENT_CLINIC_OR_DEPARTMENT_OTHER): Payer: Self-pay | Admitting: Orthopedic Surgery

## 2019-10-28 ENCOUNTER — Ambulatory Visit (HOSPITAL_BASED_OUTPATIENT_CLINIC_OR_DEPARTMENT_OTHER)
Admission: RE | Admit: 2019-10-28 | Discharge: 2019-10-28 | Disposition: A | Discharge status: Home / Self Care | Payer: Medicare Other | Attending: Orthopedic Surgery | Admitting: Orthopedic Surgery

## 2019-10-28 ENCOUNTER — Other Ambulatory Visit: Payer: Self-pay

## 2019-10-28 ENCOUNTER — Encounter (HOSPITAL_BASED_OUTPATIENT_CLINIC_OR_DEPARTMENT_OTHER)
Admission: RE | Disposition: A | Discharge status: Home / Self Care | Payer: Self-pay | Source: Home / Self Care | Attending: Orthopedic Surgery

## 2019-10-28 DIAGNOSIS — W1789XA Other fall from one level to another, initial encounter: Secondary | ICD-10-CM | POA: Insufficient documentation

## 2019-10-28 DIAGNOSIS — S62102A Fracture of unspecified carpal bone, left wrist, initial encounter for closed fracture: Secondary | ICD-10-CM

## 2019-10-28 DIAGNOSIS — S52572A Other intraarticular fracture of lower end of left radius, initial encounter for closed fracture: Secondary | ICD-10-CM | POA: Diagnosis not present

## 2019-10-28 DIAGNOSIS — E119 Type 2 diabetes mellitus without complications: Secondary | ICD-10-CM | POA: Diagnosis not present

## 2019-10-28 DIAGNOSIS — I10 Essential (primary) hypertension: Secondary | ICD-10-CM | POA: Diagnosis not present

## 2019-10-28 DIAGNOSIS — F17200 Nicotine dependence, unspecified, uncomplicated: Secondary | ICD-10-CM | POA: Diagnosis not present

## 2019-10-28 HISTORY — PX: ORIF WRIST FRACTURE: SHX2133

## 2019-10-28 LAB — POCT I-STAT, CHEM 8
BUN: 10 mg/dL (ref 6–20)
Calcium, Ion: 1.28 mmol/L (ref 1.15–1.40)
Chloride: 106 mmol/L (ref 98–111)
Creatinine, Ser: 0.7 mg/dL (ref 0.61–1.24)
Glucose, Bld: 105 mg/dL — ABNORMAL HIGH (ref 70–99)
HCT: 47 % (ref 39.0–52.0)
Hemoglobin: 16 g/dL (ref 13.0–17.0)
Potassium: 3.7 mmol/L (ref 3.5–5.1)
Sodium: 140 mmol/L (ref 135–145)
TCO2: 22 mmol/L (ref 22–32)

## 2019-10-28 LAB — GLUCOSE, CAPILLARY: Glucose-Capillary: 98 mg/dL (ref 70–99)

## 2019-10-28 SURGERY — OPEN REDUCTION INTERNAL FIXATION (ORIF) WRIST FRACTURE
Anesthesia: General | Site: Wrist | Laterality: Left

## 2019-10-28 MED ORDER — KETOROLAC TROMETHAMINE 30 MG/ML IJ SOLN: 30.0000 mg | Freq: Once | INTRAMUSCULAR | Status: DC | PRN

## 2019-10-28 MED ORDER — OXYCODONE HCL 5 MG/5ML PO SOLN: 5.0000 mg | Freq: Once | ORAL | Status: DC | PRN

## 2019-10-28 MED ORDER — FENTANYL CITRATE (PF) 100 MCG/2ML IJ SOLN
INTRAMUSCULAR | Status: AC
  Filled 2019-10-28: qty 2

## 2019-10-28 MED ORDER — CEFAZOLIN SODIUM-DEXTROSE 2-4 GM/100ML-% IV SOLN
2.0000 g | INTRAVENOUS | Status: AC
  Administered 2019-10-28: 2 g via INTRAVENOUS

## 2019-10-28 MED ORDER — LACTATED RINGERS IV SOLN
INTRAVENOUS | Status: DC
  Administered 2019-10-28: 1000 mL via INTRAVENOUS

## 2019-10-28 MED ORDER — CELECOXIB 200 MG PO CAPS: 200.0000 mg | ORAL_CAPSULE | Freq: Two times a day (BID) | ORAL | 0 refills | Status: AC

## 2019-10-28 MED ORDER — PROPOFOL 10 MG/ML IV BOLUS
INTRAVENOUS | Status: AC
  Filled 2019-10-28: qty 20

## 2019-10-28 MED ORDER — LIDOCAINE 2% (20 MG/ML) 5 ML SYRINGE
INTRAMUSCULAR | Status: DC | PRN
  Administered 2019-10-28: 100 mg via INTRAVENOUS

## 2019-10-28 MED ORDER — FENTANYL CITRATE (PF) 100 MCG/2ML IJ SOLN
25.0000 ug | INTRAMUSCULAR | Status: DC | PRN
  Administered 2019-10-28: 50 ug via INTRAVENOUS

## 2019-10-28 MED ORDER — CHLORHEXIDINE GLUCONATE 4 % EX LIQD: 60.0000 mL | Freq: Once | CUTANEOUS | Status: DC

## 2019-10-28 MED ORDER — CLONIDINE HCL (ANALGESIA) 100 MCG/ML EP SOLN
EPIDURAL | Status: DC | PRN
  Administered 2019-10-28: 100 ug

## 2019-10-28 MED ORDER — FENTANYL CITRATE (PF) 100 MCG/2ML IJ SOLN
100.0000 ug | Freq: Once | INTRAMUSCULAR | Status: AC
  Administered 2019-10-28: 100 ug via INTRAVENOUS

## 2019-10-28 MED ORDER — ONDANSETRON HCL 4 MG PO TABS: 4.0000 mg | ORAL_TABLET | Freq: Three times a day (TID) | ORAL | 0 refills | Status: AC | PRN

## 2019-10-28 MED ORDER — OXYCODONE HCL 5 MG PO TABS: 5.0000 mg | ORAL_TABLET | ORAL | 0 refills | Status: AC | PRN

## 2019-10-28 MED ORDER — LIDOCAINE 2% (20 MG/ML) 5 ML SYRINGE
INTRAMUSCULAR | Status: AC
  Filled 2019-10-28: qty 5

## 2019-10-28 MED ORDER — ACETAMINOPHEN 500 MG PO TABS
1000.0000 mg | ORAL_TABLET | Freq: Once | ORAL | Status: AC
  Administered 2019-10-28: 1000 mg via ORAL

## 2019-10-28 MED ORDER — ROPIVACAINE HCL 5 MG/ML IJ SOLN
INTRAMUSCULAR | Status: DC | PRN
  Administered 2019-10-28: 30 mL via PERINEURAL

## 2019-10-28 MED ORDER — CEFAZOLIN SODIUM-DEXTROSE 2-4 GM/100ML-% IV SOLN
INTRAVENOUS | Status: AC
  Filled 2019-10-28: qty 100

## 2019-10-28 MED ORDER — OXYCODONE HCL 5 MG PO TABS: 5.0000 mg | ORAL_TABLET | Freq: Once | ORAL | Status: DC | PRN

## 2019-10-28 MED ORDER — ACETAMINOPHEN 500 MG PO TABS
ORAL_TABLET | ORAL | Status: AC
  Filled 2019-10-28: qty 2

## 2019-10-28 MED ORDER — PROPOFOL 10 MG/ML IV BOLUS
INTRAVENOUS | Status: DC | PRN
  Administered 2019-10-28: 200 mg via INTRAVENOUS

## 2019-10-28 MED ORDER — ONDANSETRON HCL 4 MG/2ML IJ SOLN
INTRAMUSCULAR | Status: DC | PRN
  Administered 2019-10-28: 4 mg via INTRAVENOUS

## 2019-10-28 MED ORDER — FENTANYL CITRATE (PF) 100 MCG/2ML IJ SOLN
INTRAMUSCULAR | Status: DC | PRN
  Administered 2019-10-28: 100 ug via INTRAVENOUS

## 2019-10-28 MED ORDER — GABAPENTIN 300 MG PO CAPS: 300.0000 mg | ORAL_CAPSULE | Freq: Two times a day (BID) | ORAL | 0 refills | Status: AC | PRN

## 2019-10-28 MED ORDER — DEXAMETHASONE SODIUM PHOSPHATE 10 MG/ML IJ SOLN
INTRAMUSCULAR | Status: DC | PRN
  Administered 2019-10-28: 5 mg via INTRAVENOUS

## 2019-10-28 MED ORDER — MIDAZOLAM HCL 2 MG/2ML IJ SOLN
2.0000 mg | Freq: Once | INTRAMUSCULAR | Status: AC
  Administered 2019-10-28: 2 mg via INTRAVENOUS

## 2019-10-28 MED ORDER — LACTATED RINGERS IV SOLN: INTRAVENOUS | Status: DC

## 2019-10-28 MED ORDER — MIDAZOLAM HCL 2 MG/2ML IJ SOLN
INTRAMUSCULAR | Status: AC
  Filled 2019-10-28: qty 2

## 2019-10-28 SURGICAL SUPPLY — 75 items
APL PRP STRL LF DISP 70% ISPRP (MISCELLANEOUS) ×1
BIT DRILL 2.2 SS TIBIAL (BIT) ×2 IMPLANT
BLADE SURG 15 STRL LF DISP TIS (BLADE) ×2 IMPLANT
BLADE SURG 15 STRL SS (BLADE) ×6
BNDG CMPR 9X4 STRL LF SNTH (GAUZE/BANDAGES/DRESSINGS) ×1
BNDG ELASTIC 4X5.8 VLCR STR LF (GAUZE/BANDAGES/DRESSINGS) ×3 IMPLANT
BNDG ESMARK 4X9 LF (GAUZE/BANDAGES/DRESSINGS) ×3 IMPLANT
CHLORAPREP W/TINT 26 (MISCELLANEOUS) ×3 IMPLANT
CLOSURE STERI-STRIP 1/2X4 (GAUZE/BANDAGES/DRESSINGS) ×1
CLOSURE WOUND 1/2 X4 (GAUZE/BANDAGES/DRESSINGS) ×1
CLSR STERI-STRIP ANTIMIC 1/2X4 (GAUZE/BANDAGES/DRESSINGS) ×2 IMPLANT
CORD BIPOLAR FORCEPS 12FT (ELECTRODE) ×3 IMPLANT
COVER BACK TABLE 60X90IN (DRAPES) ×3 IMPLANT
COVER WAND RF STERILE (DRAPES) ×3 IMPLANT
CUFF TOURN SGL QUICK 18X4 (TOURNIQUET CUFF) IMPLANT
CUFF TOURN SGL QUICK 24 (TOURNIQUET CUFF)
CUFF TRNQT CYL 24X4X16.5-23 (TOURNIQUET CUFF) IMPLANT
DECANTER SPIKE VIAL GLASS SM (MISCELLANEOUS) IMPLANT
DRAPE EXTREMITY T 121X128X90 (DISPOSABLE) ×3 IMPLANT
DRAPE IMP U-DRAPE 54X76 (DRAPES) ×3 IMPLANT
DRAPE OEC MINIVIEW 54X84 (DRAPES) ×3 IMPLANT
DRAPE SURG 17X23 STRL (DRAPES) IMPLANT
DRSG EMULSION OIL 3X3 NADH (GAUZE/BANDAGES/DRESSINGS) ×3 IMPLANT
GAUZE SPONGE 4X4 12PLY STRL (GAUZE/BANDAGES/DRESSINGS) ×3 IMPLANT
GAUZE XEROFORM 1X8 LF (GAUZE/BANDAGES/DRESSINGS) IMPLANT
GLOVE BIO SURGEON STRL SZ7.5 (GLOVE) ×6 IMPLANT
GLOVE BIOGEL PI IND STRL 8 (GLOVE) ×2 IMPLANT
GLOVE BIOGEL PI INDICATOR 8 (GLOVE) ×4
GOWN STRL REUS W/ TWL LRG LVL3 (GOWN DISPOSABLE) ×2 IMPLANT
GOWN STRL REUS W/TWL LRG LVL3 (GOWN DISPOSABLE) ×6
K-WIRE 1.6 (WIRE) ×6
K-WIRE FX5X1.6XNS BN SS (WIRE) ×2
KWIRE FX5X1.6XNS BN SS (WIRE) IMPLANT
NDL HYPO 25X1 1.5 SAFETY (NEEDLE) ×1 IMPLANT
NEEDLE HYPO 25X1 1.5 SAFETY (NEEDLE) ×3 IMPLANT
NS IRRIG 1000ML POUR BTL (IV SOLUTION) ×3 IMPLANT
PAD CAST 4YDX4 CTTN HI CHSV (CAST SUPPLIES) ×1 IMPLANT
PADDING CAST ABS 4INX4YD NS (CAST SUPPLIES) ×2
PADDING CAST ABS COTTON 4X4 ST (CAST SUPPLIES) ×1 IMPLANT
PADDING CAST COTTON 4X4 STRL (CAST SUPPLIES) ×3
PEG LOCKING SMOOTH 2.2X18 (Peg) ×2 IMPLANT
PEG LOCKING SMOOTH 2.2X20 (Screw) ×14 IMPLANT
PEG LOCKING SMOOTH 2.2X22 (Screw) ×2 IMPLANT
PLATE WIDE DVR LEFT (Plate) ×2 IMPLANT
SCREW LOCK 16X2.7X 3 LD TPR (Screw) IMPLANT
SCREW LOCK 18X2.7X 3 LD TPR (Screw) IMPLANT
SCREW LOCKING 2.7X16 (Screw) ×6 IMPLANT
SCREW LOCKING 2.7X18 (Screw) ×3 IMPLANT
SET BASIN DAY SURGERY F.S. (CUSTOM PROCEDURE TRAY) ×3 IMPLANT
SLEEVE SCD COMPRESS KNEE MED (MISCELLANEOUS) IMPLANT
SLING ARM FOAM STRAP XLG (SOFTGOODS) ×2 IMPLANT
SPLINT FAST PLASTER 5X30 (CAST SUPPLIES)
SPLINT PLASTER CAST FAST 5X30 (CAST SUPPLIES) IMPLANT
SPLINT PLASTER CAST XFAST 3X15 (CAST SUPPLIES) IMPLANT
SPLINT PLASTER CAST XFAST 4X15 (CAST SUPPLIES) IMPLANT
SPLINT PLASTER XTRA FAST SET 4 (CAST SUPPLIES)
SPLINT PLASTER XTRA FASTSET 3X (CAST SUPPLIES)
SPONGE LAP 4X18 RFD (DISPOSABLE) ×3 IMPLANT
STRIP CLOSURE SKIN 1/2X4 (GAUZE/BANDAGES/DRESSINGS) ×1 IMPLANT
SUCTION FRAZIER HANDLE 10FR (MISCELLANEOUS)
SUCTION TUBE FRAZIER 10FR DISP (MISCELLANEOUS) IMPLANT
SUT ETHILON 3 0 PS 1 (SUTURE) IMPLANT
SUT MON AB 2-0 CT1 36 (SUTURE) ×3 IMPLANT
SUT MON AB 4-0 PC3 18 (SUTURE) IMPLANT
SUT PROLENE 3 0 PS 2 (SUTURE) IMPLANT
SUT VIC AB 0 SH 27 (SUTURE) IMPLANT
SUT VIC AB 2-0 SH 27 (SUTURE)
SUT VIC AB 2-0 SH 27XBRD (SUTURE) IMPLANT
SUT VIC AB 3-0 FS2 27 (SUTURE) IMPLANT
SYR BULB EAR ULCER 3OZ GRN STR (SYRINGE) ×3 IMPLANT
SYR CONTROL 10ML LL (SYRINGE) ×3 IMPLANT
TOWEL OR 17X26 10 PK STRL BLUE (TOWEL DISPOSABLE) ×3 IMPLANT
TUBE CONNECTING 12'X1/4 (SUCTIONS)
TUBE CONNECTING 12X1/4 (SUCTIONS) IMPLANT
UNDERPAD 30X30 (UNDERPADS AND DIAPERS) ×3 IMPLANT

## 2019-10-28 NOTE — Transfer of Care (Signed)
Immediate Anesthesia Transfer of Care Note  Patient: Peter Zimmerman  Procedure(s) Performed: Procedure(s) (LRB): OPEN REDUCTION INTERNAL FIXATION (ORIF) WRIST FRACTURE (Left)  Patient Location: PACU  Anesthesia Type: General  Level of Consciousness: awake, oriented, sedated and patient cooperative  Airway & Oxygen Therapy: Patient Spontanous Breathing and Patient connected to face mask oxygen  Post-op Assessment: Report given to PACU RN and Post -op Vital signs reviewed and stable  Post vital signs: Reviewed and stable  Complications: No apparent anesthesia complications Last Vitals:  Vitals Value Taken Time  BP 115/60 10/28/19 1308  Temp    Pulse 76 10/28/19 1312  Resp 18 10/28/19 1312  SpO2 95 % 10/28/19 1312  Vitals shown include unvalidated device data.  Last Pain:  Vitals:   10/28/19 1014  TempSrc: Oral  PainSc: 7       Patients Stated Pain Goal: 7(usually stays at about 7/10) (10/28/19 1014)

## 2019-10-28 NOTE — Progress Notes (Signed)
Assisted Dr. Rose with left, ultrasound guided, interscalene  block. Side rails up, monitors on throughout procedure. See vital signs in flow sheet. Tolerated Procedure well.  

## 2019-10-28 NOTE — Anesthesia Procedure Notes (Signed)
Anesthesia Procedure Image    

## 2019-10-28 NOTE — Anesthesia Preprocedure Evaluation (Signed)
Anesthesia Evaluation  Patient identified by MRN, date of birth, ID band Patient awake    Reviewed: Allergy & Precautions, NPO status , Patient's Chart, lab work & pertinent test results  Airway Mallampati: II  TM Distance: >3 FB Neck ROM: Full    Dental no notable dental hx.    Pulmonary neg pulmonary ROS, Current Smoker,    Pulmonary exam normal breath sounds clear to auscultation       Cardiovascular hypertension, Normal cardiovascular exam Rhythm:Regular Rate:Normal     Neuro/Psych negative neurological ROS  negative psych ROS   GI/Hepatic negative GI ROS, Neg liver ROS,   Endo/Other  diabetes  Renal/GU negative Renal ROS  negative genitourinary   Musculoskeletal negative musculoskeletal ROS (+)   Abdominal   Peds negative pediatric ROS (+)  Hematology negative hematology ROS (+)   Anesthesia Other Findings   Reproductive/Obstetrics negative OB ROS                             Anesthesia Physical Anesthesia Plan  ASA: II  Anesthesia Plan: General   Post-op Pain Management:  Regional for Post-op pain   Induction: Intravenous  PONV Risk Score and Plan: 1 and Ondansetron, Dexamethasone and Treatment may vary due to age or medical condition  Airway Management Planned: LMA  Additional Equipment:   Intra-op Plan:   Post-operative Plan: Extubation in OR  Informed Consent: I have reviewed the patients History and Physical, chart, labs and discussed the procedure including the risks, benefits and alternatives for the proposed anesthesia with the patient or authorized representative who has indicated his/her understanding and acceptance.     Dental advisory given  Plan Discussed with: CRNA and Surgeon  Anesthesia Plan Comments:         Anesthesia Quick Evaluation

## 2019-10-28 NOTE — Anesthesia Procedure Notes (Signed)
Anesthesia Regional Block: Supraclavicular block   Pre-Anesthetic Checklist: ,, timeout performed, Correct Patient, Correct Site, Correct Laterality, Correct Procedure, Correct Position, site marked, Risks and benefits discussed,  Surgical consent,  Pre-op evaluation,  At surgeon's request and post-op pain management  Laterality: Left  Prep: chloraprep       Needles:  Injection technique: Single-shot  Needle Type: Echogenic Needle     Needle Length: 9cm      Additional Needles:   Procedures:,,,, ultrasound used (permanent image in chart),,,,  Narrative:  Start time: 10/28/2019 11:23 AM End time: 10/28/2019 11:31 AM Injection made incrementally with aspirations every 5 mL.  Performed by: Personally  Anesthesiologist: Eilene Ghazi, MD  Additional Notes: Patient tolerated the procedure well without complications

## 2019-10-28 NOTE — Anesthesia Postprocedure Evaluation (Signed)
Anesthesia Post Note  Patient: Peter Zimmerman  Procedure(s) Performed: OPEN REDUCTION INTERNAL FIXATION (ORIF) WRIST FRACTURE (Left Wrist)     Patient location during evaluation: PACU Anesthesia Type: General Level of consciousness: awake and alert Pain management: pain level controlled Vital Signs Assessment: post-procedure vital signs reviewed and stable Respiratory status: spontaneous breathing, nonlabored ventilation, respiratory function stable and patient connected to nasal cannula oxygen Cardiovascular status: blood pressure returned to baseline and stable Postop Assessment: no apparent nausea or vomiting Anesthetic complications: no    Last Vitals:  Vitals:   10/28/19 1330 10/28/19 1345  BP: 98/62 (!) 95/55  Pulse: 73 70  Resp: 12 17  Temp:    SpO2: 92% 93%    Last Pain:  Vitals:   10/28/19 1330  TempSrc:   PainSc: 8                  Abrianna Sidman S

## 2019-10-28 NOTE — Discharge Instructions (Signed)
Keep wrist elevated with ice as much as possible to reduce pain and swelling. If needed, you may increase pain medication for the first few days post op to 2 tablets every 4 hours.  Stop as needed pain medication as soon as you are able.  Diet: As you were doing prior to hospitalization   Shower:  You have a splint on, leave the splint in place and keep the splint dry with a plastic bag.  Dressing:  You have a splint - leave the splint in place and we will change your bandages during your first follow-up appointment.    Activity:  Increase activity slowly as tolerated, but follow the weight bearing instructions below.  The rules on driving is that you can not be taking narcotics while you drive, and you must feel in control of the vehicle.    Weight Bearing:  Non weight bearing affected wrist.  Sling for comfort.   To prevent constipation: you may use a stool softener such as -  Colace (over the counter) 100 mg by mouth twice a day  Drink plenty of fluids (prune juice may be helpful) and high fiber foods Miralax (over the counter) for constipation as needed.    Itching:  If you experience itching with your medications, try taking only a single pain pill, or even half a pain pill at a time.  You can also use benadryl over the counter for itching or also to help with sleep.   Precautions:  If you experience chest pain or shortness of breath - call 911 immediately for transfer to the hospital emergency department!!  If you develop a fever greater that 101 F, purulent drainage from wound, increased redness or drainage from wound, or calf pain -- Call the office at [phone]                                         Follow- Up Appointment:  Please call for an appointment to be seen in 2 weeks [place] - [phone]  Regional Anesthesia Blocks  1. Numbness or the inability to move the "blocked" extremity may last from 3-48 hours after placement. The length of time depends on the  medication injected and your individual response to the medication. If the numbness is not going away after 48 hours, call your surgeon.  2. The extremity that is blocked will need to be protected until the numbness is gone and the  Strength has returned. Because you cannot feel it, you will need to take extra care to avoid injury. Because it may be weak, you may have difficulty moving it or using it. You may not know what position it is in without looking at it while the block is in effect.  3. For blocks in the legs and feet, returning to weight bearing and walking needs to be done carefully. You will need to wait until the numbness is entirely gone and the strength has returned. You should be able to move your leg and foot normally before you try and bear weight or walk. You will need someone to be with you when you first try to ensure you do not fall and possibly risk injury.  4. Bruising and tenderness at the needle site are common side effects and will resolve in a few days.  5. Persistent numbness or new problems with movement should be communicated to the surgeon or  the [place] [phone]).   Post Anesthesia Home Care Instructions  Activity: Get plenty of rest for the remainder of the day. A responsible individual must stay with you for 24 hours following the procedure.  For the next 24 hours, DO NOT: -Drive a car -Paediatric nurse -Drink alcoholic beverages -Take any medication unless instructed by your physician -Make any legal decisions or sign important papers.  Meals: Start with liquid foods such as gelatin or soup. Progress to regular foods as tolerated. Avoid greasy, spicy, heavy foods. If nausea and/or vomiting occur, drink only clear liquids until the nausea and/or vomiting subsides. Call your physician if vomiting continues.  Special Instructions/Symptoms: Your throat may feel dry or sore from the anesthesia or  the breathing tube placed in your throat during surgery. If this causes discomfort, gargle with warm salt water. The discomfort should disappear within 24 hours.

## 2019-10-28 NOTE — Anesthesia Procedure Notes (Signed)
Procedure Name: LMA Insertion Date/Time: 10/28/2019 12:00 PM Performed by: Francie Massing, CRNA Pre-anesthesia Checklist: Patient identified, Emergency Drugs available, Suction available and Patient being monitored Patient Re-evaluated:Patient Re-evaluated prior to induction Oxygen Delivery Method: Circle system utilized Preoxygenation: Pre-oxygenation with 100% oxygen Induction Type: IV induction Ventilation: Mask ventilation without difficulty LMA: LMA inserted LMA Size: 4.0 Number of attempts: 1 Airway Equipment and Method: Bite block Placement Confirmation: positive ETCO2 Tube secured with: Tape Dental Injury: Teeth and Oropharynx as per pre-operative assessment

## 2019-10-28 NOTE — Interval H&P Note (Signed)
I participated in the care of this patient and agree with the above history, physical and evaluation. I performed a review of the history and a physical exam as detailed   Garey Alleva Daniel Talton Delpriore MD  

## 2019-10-28 NOTE — Op Note (Signed)
10/28/2019  12:45 PM  PATIENT:  Peter Zimmerman    PRE-OPERATIVE DIAGNOSIS:  LEFT WRIST FRACTURE  POST-OPERATIVE DIAGNOSIS:  Same  PROCEDURE:  OPEN REDUCTION INTERNAL FIXATION (ORIF) WRIST FRACTURE  SURGEON:  Sheral Apley, MD  ASSISTANT: Aquilla Hacker, PA-C, he was present and scrubbed throughout the case, critical for completion in a timely fashion, and for retraction, instrumentation, and closure.   ANESTHESIA:   gen  PREOPERATIVE INDICATIONS:  Peter Zimmerman is a  54 y.o. male with a diagnosis of LEFT WRIST FRACTURE who failed conservative measures and elected for surgical management.    The risks benefits and alternatives were discussed with the patient preoperatively including but not limited to the risks of infection, bleeding, nerve injury, cardiopulmonary complications, the need for revision surgery, among others, and the patient was willing to proceed.  OPERATIVE IMPLANTS: DVR plate  OPERATIVE FINDINGS: unstable fracture  BLOOD LOSS: min  COMPLICATIONS: none  TOURNIQUET TIME:  OPERATIVE PROCEDURE:  Patient was identified in the preoperative holding area and site was marked by me He was transported to the operating theater and placed on the table in supine position taking care to pad all bony prominences. After a preincinduction time out anesthesia was induced. The left upper extremity was prepped and draped in normal sterile fashion and a pre-incision timeout was performed. He received ancef for preoperative antibiotics.   I made a 5 cm incision centered over her FCR tendon and dissected down carefully to the level of the flexor tendon sheath and incise this longitudinally and retracted the FCR radially and incised the dorsal aspect of the sheath.   I bluntly dissected the FPL muscle belly away from the brachioradialis and then sharply incised the pronator tendon from the distal radius and from the wrist capsule. I Elevated this off the bone the  fractures visible.   I released the brachioradialis from its insertion. I then debrided the fracture and performed a manual reduction.   I selected a plate and I placed it on the bone. I pinned it into place and was happy on multiple radiographic views with it's placement. I then fixed the plate distally with the locking pegs. I confirmed no articular penetration with the pegs and that none were prominent dorsally.   I then reduced the plate to the shaft improving the volar and radial tilt of her distal radius.  I was happy with the final fluoro xrays    I thoroughly irrigated the wound and closed the pronator over top of the plate and then closed the skin in layers with absorbable stitch. Sterile dressing was applied using the PACU in stable condition.  POST OPERATIVE PLAN: NWB, Splint full time. Ambulate for DVT px.

## 2019-10-29 NOTE — Op Note (Signed)
10/28/2019  1:57 PM  PATIENT:  [name]    PRE-OPERATIVE DIAGNOSIS:  LEFT WRIST FRACTURE  POST-OPERATIVE DIAGNOSIS:  Same  PROCEDURE:  OPEN REDUCTION INTERNAL FIXATION (ORIF) WRIST FRACTURE  SURGEON:  Sheral Apley, MD  ASSISTANT: Aquilla Hacker, PA-C, he was present and scrubbed throughout the case, critical for completion in a timely fashion, and for retraction, instrumentation, and closure.   ANESTHESIA:   gen  PREOPERATIVE INDICATIONS:  PRAKASH KIMBERLING is a  54 y.o. male with a diagnosis of LEFT WRIST FRACTURE who failed conservative measures and elected for surgical management.    The risks benefits and alternatives were discussed with the patient preoperatively including but not limited to the risks of infection, bleeding, nerve injury, cardiopulmonary complications, the need for revision surgery, among others, and the patient was willing to proceed.  OPERATIVE IMPLANTS: DVR plate  OPERATIVE FINDINGS: untable fx  BLOOD LOSS: min  COMPLICATIONS: none  TOURNIQUET TIME: 45  OPERATIVE PROCEDURE:  Patient was identified in the preoperative holding area and site was marked by me He was transported to the operating theater and placed on the table in supine position taking care to pad all bony prominences. After a preincinduction time out anesthesia was induced. The left upper extremity was prepped and draped in normal sterile fashion and a pre-incision timeout was performed. He received ancef for preoperative antibiotics.   I made a 5 cm incision centered over her FCR tendon and dissected down carefully to the level of the flexor tendon sheath and incise this longitudinally and retracted the FCR radially and incised the dorsal aspect of the sheath.   I bluntly dissected the FPL muscle belly away from the brachioradialis and then sharply incised the pronator tendon from the distal radius and from the wrist capsule. I Elevated this off the bone the fractures visible.    I released the brachioradialis from its insertion. I then debrided the fracture and performed a manual reduction.   I selected a plate and I placed it on the bone. I pinned it into place and was happy on multiple radiographic views with it's placement. I then fixed the plate distally with the locking pegs. I confirmed no articular penetration with the pegs and that none were prominent dorsally.   I then reduced the plate to the shaft improving the volar and radial tilt of her distal radius.  I was happy with the final fluoro xrays    I thoroughly irrigated the wound and closed the pronator over top of the plate and then closed the skin in layers with absorbable stitch. Sterile dressing was applied using the PACU in stable condition.  POST OPERATIVE PLAN: NWB, Splint full time. Ambulate for DVT px.

## 2020-11-29 IMAGING — CT CT ABD-PELV W/ CM
2 of 5 series · 16 of 46 positions shown, 18 images · IV contrast (APPLIED)
Comparison: 12/12/2007

CLINICAL DATA: Left-sided abdominal pain.

EXAM:
CT ABDOMEN AND PELVIS WITH CONTRAST
TECHNIQUE: Multidetector CT imaging of the abdomen and pelvis was performed
using the standard protocol following bolus administration of
intravenous contrast.
CONTRAST:  100mL OMNIPAQUE IOHEXOL 300 MG/ML  SOLN

[Series 2: axial st · axial · 0.97mm/px · z∈[+910,+1345]mm · 13 of 101 slices shown, 15 images]
[im 7/101  soft-tissue]
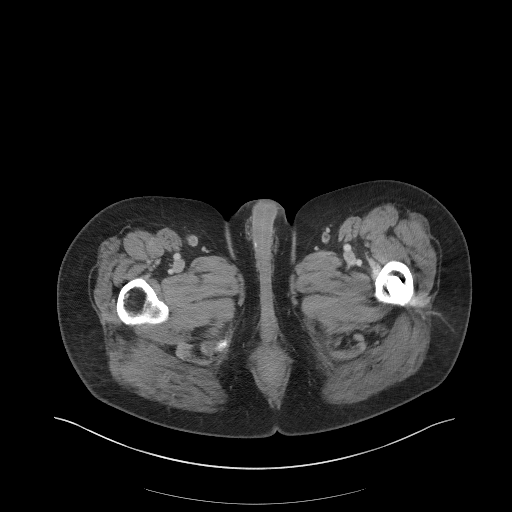
[im 7/101  bone]
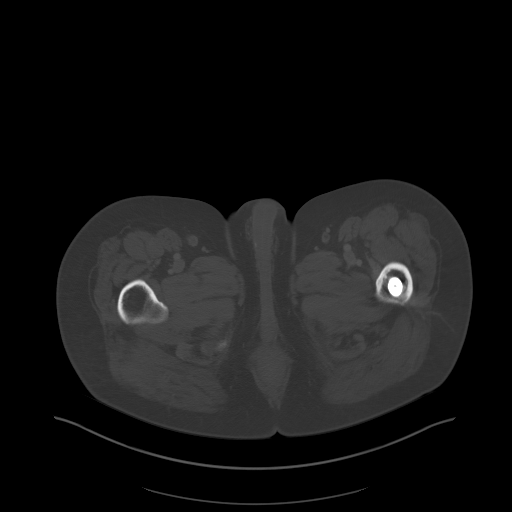
[im 14/101  soft-tissue]
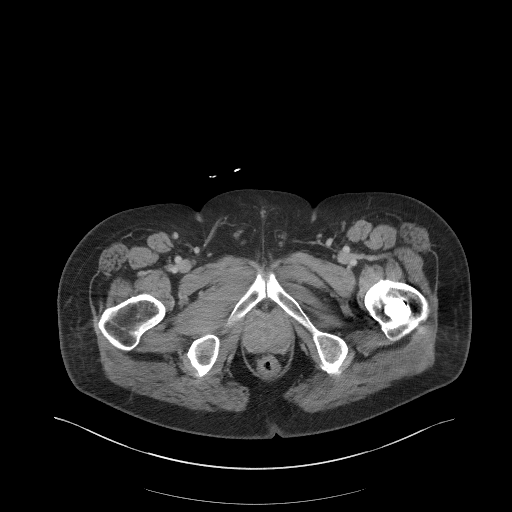
[im 21/101  soft-tissue]
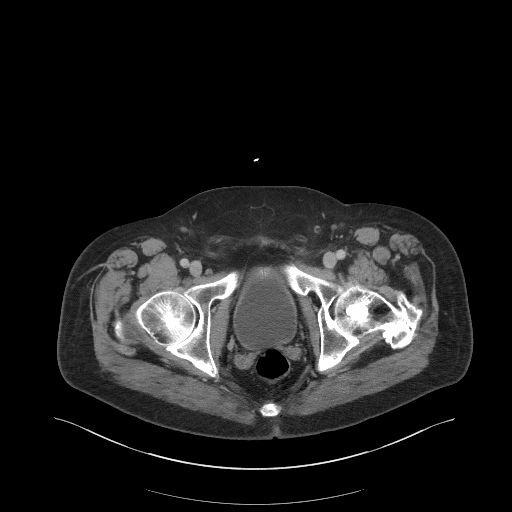
[im 27/101  soft-tissue]
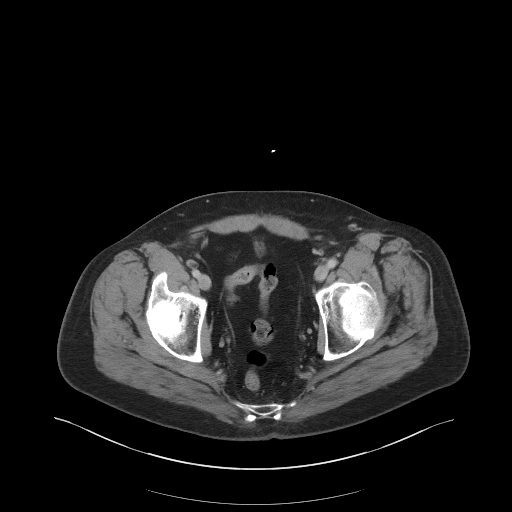
[im 34/101  soft-tissue]
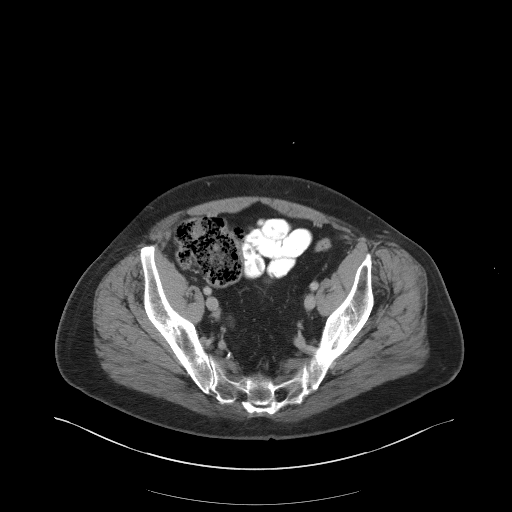
[im 41/101  soft-tissue]
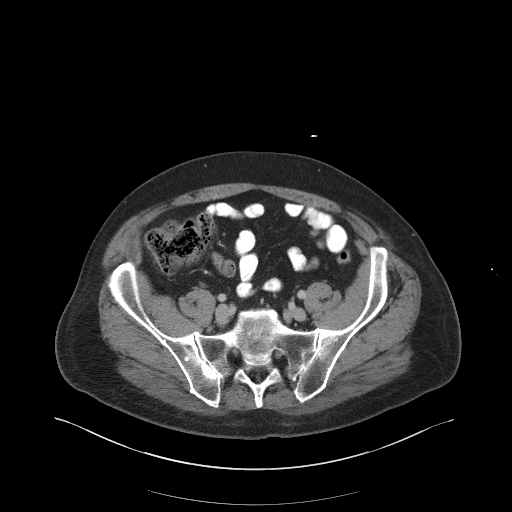
[im 54/101  soft-tissue]
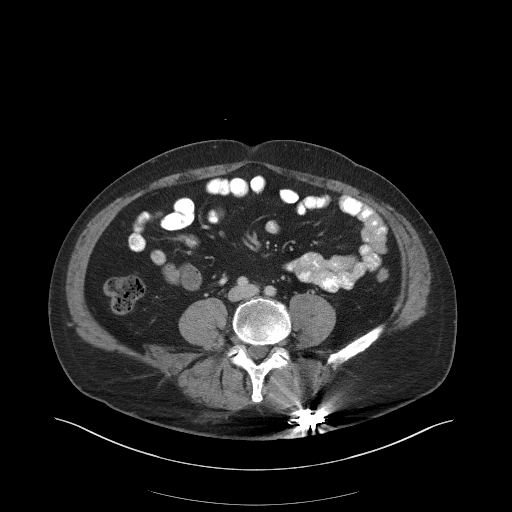
[im 61/101  soft-tissue]
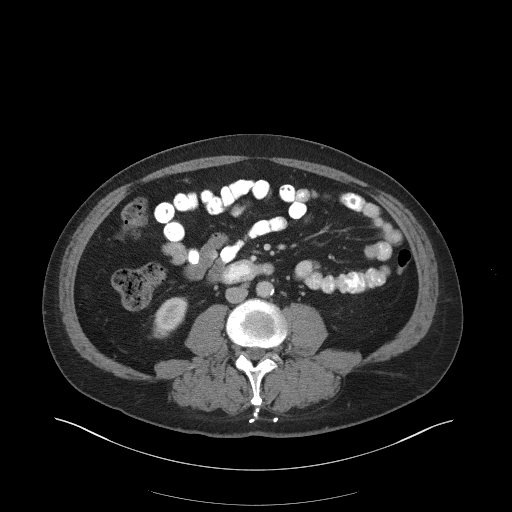
[im 67/101  soft-tissue]
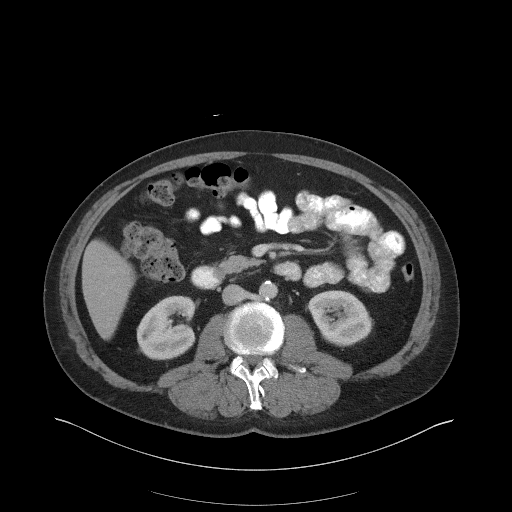
[im 67/101  bone]
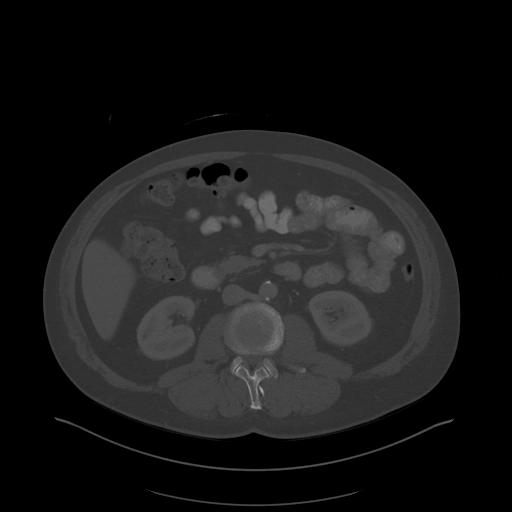
[im 74/101  soft-tissue]
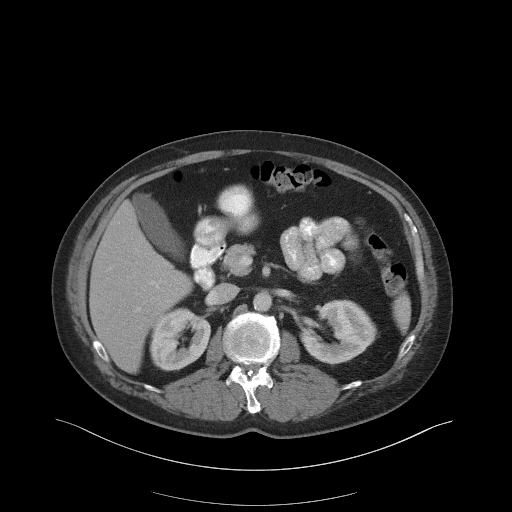
[im 81/101  soft-tissue]
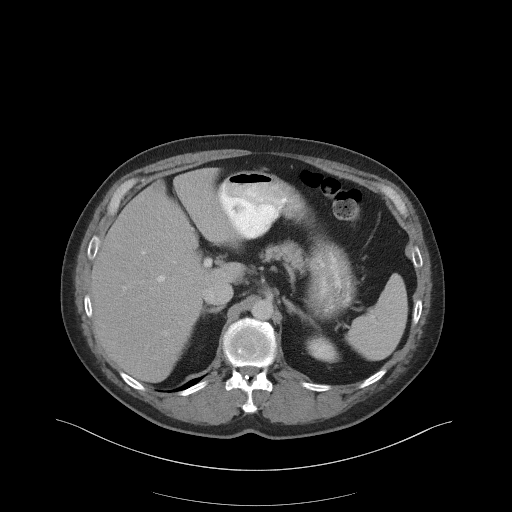
[im 87/101  soft-tissue]
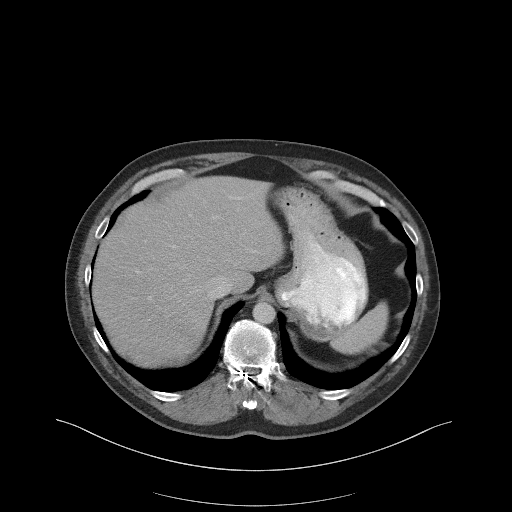
[im 94/101  soft-tissue]
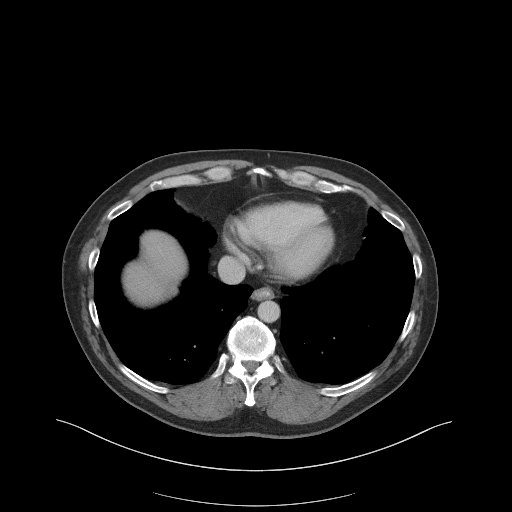

[Series 4: coronal st · coronal · 0.83mm/px · 3 of 109 slices shown]
[im 37/109  soft-tissue]
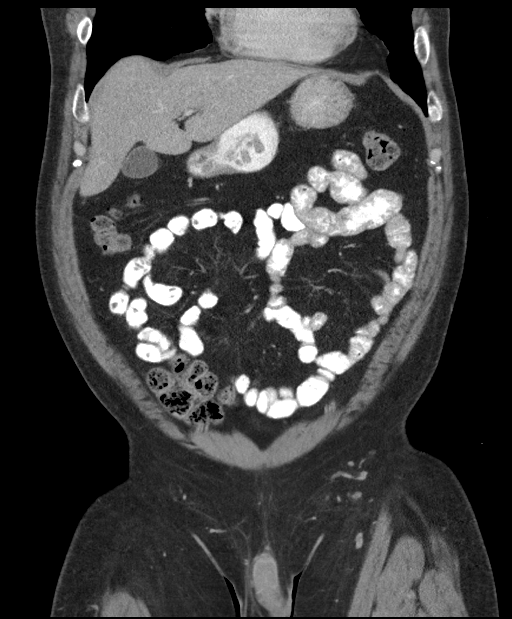
[im 49/109  soft-tissue]
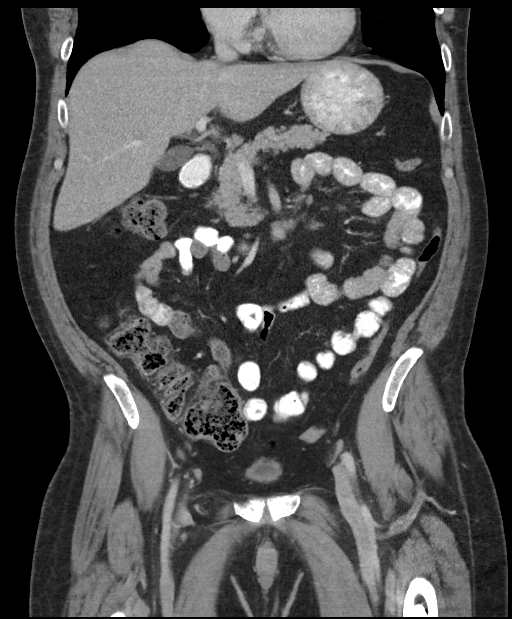
[im 61/109  soft-tissue]
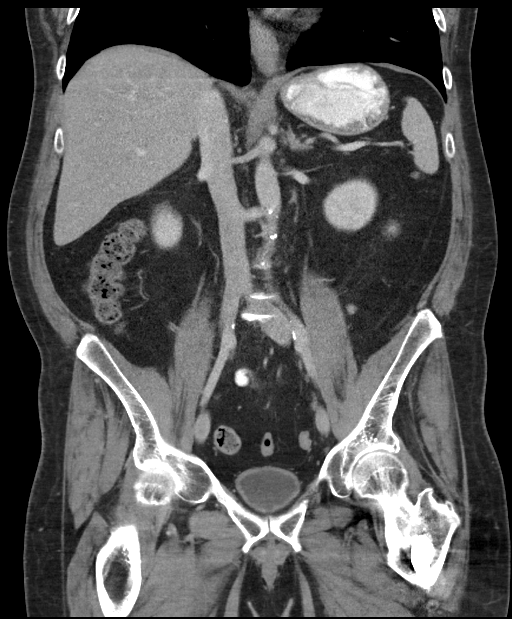

[16 of 46 positions shown; findings below may reference images not displayed]

FINDINGS: Lower chest: Unremarkable

Hepatobiliary: Small area of low attenuation in the anterior liver,
adjacent to the falciform ligament, is in a characteristic location
for focal fatty deposition. No suspicious focal abnormality within
the liver parenchyma. There is no evidence for gallstones,
gallbladder wall thickening, or pericholecystic fluid. No
intrahepatic or extrahepatic biliary dilation.

Pancreas: No focal mass lesion. No dilatation of the main duct. No
intraparenchymal cyst. No peripancreatic edema.

Spleen: No splenomegaly. No focal mass lesion.

Adrenals/Urinary Tract: No adrenal nodule or mass. 18 mm cyst noted
interpolar left kidney with 15 mm cyst noted in the interpolar right
kidney. Additional very tiny hypoattenuating lesions in both kidneys
are too small to characterize but most likely benign. No evidence
for hydroureter. The urinary bladder appears normal for the degree
of distention.

Stomach/Bowel: Stomach is unremarkable. No gastric wall thickening.
No evidence of outlet obstruction. Duodenum is normally positioned
as is the ligament of Treitz. No small bowel wall thickening. No
small bowel dilatation. The terminal ileum is normal. The appendix
is normal. No gross colonic mass. No colonic wall thickening.

Vascular/Lymphatic: There is abdominal aortic atherosclerosis
without aneurysm. There is no gastrohepatic or hepatoduodenal
ligament lymphadenopathy. No retroperitoneal or mesenteric
lymphadenopathy. No pelvic sidewall lymphadenopathy.

Reproductive: The prostate gland and seminal vesicles are
unremarkable.

Other: No intraperitoneal free fluid.

Musculoskeletal: Fixation hardware noted left femur, incompletely
visualized. No worrisome lytic or sclerotic osseous abnormality.
Spinal stimulator device evident.
IMPRESSION: 1. No acute findings in the abdomen or pelvis. Specifically, no
findings to explain the patient's history of left-sided abdominal
pain.
2. Bilateral renal cysts.
3. Aortic Atherosclerosis ([id number]).

## 2022-05-23 ENCOUNTER — Other Ambulatory Visit (HOSPITAL_COMMUNITY): Payer: Self-pay

## 2022-05-23 ENCOUNTER — Other Ambulatory Visit: Payer: Self-pay

## 2022-05-23 MED ORDER — OXYCODONE-ACETAMINOPHEN 10-325 MG PO TABS
1.0000 | ORAL_TABLET | Freq: Every day | ORAL | 0 refills | Status: AC
  Filled 2022-05-23: qty 150, 30d supply, fill #0

## 2022-06-21 ENCOUNTER — Other Ambulatory Visit (HOSPITAL_COMMUNITY): Payer: Self-pay

## 2022-06-22 ENCOUNTER — Other Ambulatory Visit (HOSPITAL_COMMUNITY): Payer: Self-pay

## 2022-06-22 MED ORDER — OXYCODONE-ACETAMINOPHEN 10-325 MG PO TABS
1.0000 | ORAL_TABLET | ORAL | 0 refills | Status: DC
  Filled 2022-06-22: qty 150, 25d supply, fill #0

## 2022-06-22 MED ORDER — TOPIRAMATE 100 MG PO TABS
100.0000 mg | ORAL_TABLET | Freq: Two times a day (BID) | ORAL | 0 refills | Status: AC
  Filled 2022-06-22: qty 120, 60d supply, fill #0

## 2022-06-22 MED ORDER — TOPIRAMATE 100 MG PO TABS
100.0000 mg | ORAL_TABLET | Freq: Two times a day (BID) | ORAL | 0 refills | Status: AC
  Filled 2022-06-22: qty 180, 90d supply, fill #0

## 2022-06-22 MED ORDER — OXYCODONE-ACETAMINOPHEN 10-325 MG PO TABS
1.0000 | ORAL_TABLET | Freq: Every day | ORAL | 0 refills | Status: AC
  Filled 2022-06-22: qty 150, 30d supply, fill #0

## 2022-06-22 MED ORDER — OXYCODONE-ACETAMINOPHEN 10-325 MG PO TABS: 1.0000 | ORAL_TABLET | ORAL | 0 refills | Status: DC

## 2022-06-22 MED ORDER — OXYCODONE-ACETAMINOPHEN 10-325 MG PO TABS
1.0000 | ORAL_TABLET | Freq: Every day | ORAL | 0 refills | Status: DC
  Filled 2022-07-21: qty 150, 30d supply, fill #0

## 2022-07-20 ENCOUNTER — Other Ambulatory Visit (HOSPITAL_COMMUNITY): Payer: Self-pay

## 2022-07-21 ENCOUNTER — Other Ambulatory Visit (HOSPITAL_COMMUNITY): Payer: Self-pay

## 2022-08-03 ENCOUNTER — Other Ambulatory Visit (HOSPITAL_COMMUNITY): Payer: Self-pay

## 2022-08-17 ENCOUNTER — Other Ambulatory Visit (HOSPITAL_COMMUNITY): Payer: Self-pay

## 2022-08-17 ENCOUNTER — Other Ambulatory Visit: Payer: Self-pay

## 2022-08-17 MED ORDER — OXYCODONE-ACETAMINOPHEN 10-325 MG PO TABS
1.0000 | ORAL_TABLET | Freq: Every day | ORAL | 0 refills | Status: AC
  Filled 2022-08-17: qty 150, 30d supply, fill #0

## 2022-08-17 MED ORDER — OXYCODONE-ACETAMINOPHEN 10-325 MG PO TABS
1.0000 | ORAL_TABLET | Freq: Every day | ORAL | 0 refills | Status: DC
  Filled 2022-09-19: qty 150, 30d supply, fill #0

## 2022-08-17 MED ORDER — TOPIRAMATE 100 MG PO TABS
100.0000 mg | ORAL_TABLET | Freq: Two times a day (BID) | ORAL | 0 refills | Status: AC
  Filled 2022-08-17: qty 180, 90d supply, fill #0

## 2022-09-18 ENCOUNTER — Other Ambulatory Visit (HOSPITAL_COMMUNITY): Payer: Self-pay

## 2022-09-19 ENCOUNTER — Other Ambulatory Visit: Payer: Self-pay

## 2022-09-19 ENCOUNTER — Other Ambulatory Visit (HOSPITAL_COMMUNITY): Payer: Self-pay

## 2022-10-20 ENCOUNTER — Other Ambulatory Visit (HOSPITAL_COMMUNITY): Payer: Self-pay

## 2022-10-20 MED ORDER — TOPIRAMATE 100 MG PO TABS
100.0000 mg | ORAL_TABLET | Freq: Two times a day (BID) | ORAL | 0 refills | Status: AC
  Filled 2022-10-20: qty 180, 90d supply, fill #0

## 2022-10-20 MED ORDER — OXYCODONE-ACETAMINOPHEN 10-325 MG PO TABS
1.0000 | ORAL_TABLET | Freq: Every day | ORAL | 0 refills | Status: AC
  Filled 2022-11-20: qty 150, 30d supply, fill #0

## 2022-10-20 MED ORDER — OXYCODONE-ACETAMINOPHEN 10-325 MG PO TABS
1.0000 | ORAL_TABLET | Freq: Every day | ORAL | 0 refills | Status: AC
  Filled 2022-10-20: qty 150, 30d supply, fill #0

## 2022-11-17 ENCOUNTER — Other Ambulatory Visit (HOSPITAL_COMMUNITY): Payer: Self-pay

## 2022-11-20 ENCOUNTER — Other Ambulatory Visit (HOSPITAL_COMMUNITY): Payer: Self-pay

## 2022-11-20 ENCOUNTER — Other Ambulatory Visit: Payer: Self-pay

## 2022-12-18 ENCOUNTER — Other Ambulatory Visit (HOSPITAL_COMMUNITY): Payer: Self-pay

## 2022-12-18 MED ORDER — TOPIRAMATE 100 MG PO TABS
100.0000 mg | ORAL_TABLET | Freq: Two times a day (BID) | ORAL | 0 refills | Status: AC
  Filled 2022-12-18: qty 180, 90d supply, fill #0

## 2022-12-18 MED ORDER — CYCLOBENZAPRINE HCL 10 MG PO TABS
10.0000 mg | ORAL_TABLET | Freq: Every day | ORAL | 1 refills | Status: AC
  Filled 2022-12-18: qty 30, 30d supply, fill #0

## 2022-12-18 MED ORDER — NALOXONE HCL 4 MG/0.1ML NA LIQD
1.0000 | Freq: Once | NASAL | 0 refills | Status: AC | PRN
  Filled 2022-12-18: qty 2, 1d supply, fill #0

## 2022-12-18 MED ORDER — OXYCODONE-ACETAMINOPHEN 10-325 MG PO TABS
1.0000 | ORAL_TABLET | Freq: Every day | ORAL | 0 refills | Status: AC
  Filled 2023-01-19: qty 150, 30d supply, fill #0

## 2022-12-18 MED ORDER — OXYCODONE-ACETAMINOPHEN 10-325 MG PO TABS
1.0000 | ORAL_TABLET | Freq: Every day | ORAL | 0 refills | Status: AC
  Filled 2022-12-20: qty 150, 30d supply, fill #0

## 2022-12-19 ENCOUNTER — Other Ambulatory Visit (HOSPITAL_COMMUNITY): Payer: Self-pay

## 2022-12-20 ENCOUNTER — Other Ambulatory Visit (HOSPITAL_COMMUNITY): Payer: Self-pay

## 2022-12-29 ENCOUNTER — Other Ambulatory Visit (HOSPITAL_COMMUNITY): Payer: Self-pay

## 2023-01-18 ENCOUNTER — Other Ambulatory Visit (HOSPITAL_COMMUNITY): Payer: Self-pay

## 2023-01-19 ENCOUNTER — Other Ambulatory Visit (HOSPITAL_COMMUNITY): Payer: Self-pay

## 2023-02-15 ENCOUNTER — Other Ambulatory Visit (HOSPITAL_COMMUNITY): Payer: Self-pay

## 2023-02-15 MED ORDER — TOPIRAMATE 100 MG PO TABS
100.0000 mg | ORAL_TABLET | Freq: Two times a day (BID) | ORAL | 0 refills | Status: AC
  Filled 2023-02-15: qty 180, 90d supply, fill #0

## 2023-02-15 MED ORDER — OXYCODONE-ACETAMINOPHEN 10-325 MG PO TABS
1.0000 | ORAL_TABLET | Freq: Every day | ORAL | 0 refills | Status: AC
  Filled 2023-03-20: qty 150, 30d supply, fill #0

## 2023-02-15 MED ORDER — CYCLOBENZAPRINE HCL 10 MG PO TABS
10.0000 mg | ORAL_TABLET | Freq: Every day | ORAL | 1 refills | Status: AC
  Filled 2023-02-15: qty 30, 30d supply, fill #0

## 2023-02-15 MED ORDER — OXYCODONE-ACETAMINOPHEN 10-325 MG PO TABS
1.0000 | ORAL_TABLET | Freq: Every day | ORAL | 0 refills | Status: AC
  Filled 2023-02-16: qty 150, 30d supply, fill #0

## 2023-02-16 ENCOUNTER — Other Ambulatory Visit (HOSPITAL_COMMUNITY): Payer: Self-pay

## 2023-02-16 ENCOUNTER — Other Ambulatory Visit: Payer: Self-pay

## 2023-03-19 ENCOUNTER — Other Ambulatory Visit (HOSPITAL_COMMUNITY): Payer: Self-pay

## 2023-03-20 ENCOUNTER — Other Ambulatory Visit (HOSPITAL_COMMUNITY): Payer: Self-pay

## 2023-04-13 ENCOUNTER — Other Ambulatory Visit (HOSPITAL_COMMUNITY): Payer: Self-pay

## 2023-04-13 MED ORDER — OXYCODONE-ACETAMINOPHEN 10-325 MG PO TABS
1.0000 | ORAL_TABLET | Freq: Every day | ORAL | 0 refills | Status: AC
  Filled 2023-04-18: qty 150, 30d supply, fill #0

## 2023-04-13 MED ORDER — TOPIRAMATE 100 MG PO TABS
100.0000 mg | ORAL_TABLET | Freq: Two times a day (BID) | ORAL | 0 refills | Status: AC
  Filled 2023-04-13: qty 180, 90d supply, fill #0

## 2023-04-13 MED ORDER — OXYCODONE-ACETAMINOPHEN 10-325 MG PO TABS
1.0000 | ORAL_TABLET | Freq: Every day | ORAL | 0 refills | Status: DC
  Filled 2023-05-18: qty 150, 30d supply, fill #0

## 2023-04-16 ENCOUNTER — Other Ambulatory Visit (HOSPITAL_COMMUNITY): Payer: Self-pay

## 2023-04-17 ENCOUNTER — Other Ambulatory Visit (HOSPITAL_COMMUNITY): Payer: Self-pay

## 2023-04-18 ENCOUNTER — Other Ambulatory Visit (HOSPITAL_COMMUNITY): Payer: Self-pay

## 2023-05-17 ENCOUNTER — Other Ambulatory Visit (HOSPITAL_COMMUNITY): Payer: Self-pay

## 2023-05-18 ENCOUNTER — Other Ambulatory Visit (HOSPITAL_COMMUNITY): Payer: Self-pay

## 2023-06-15 ENCOUNTER — Other Ambulatory Visit: Payer: Self-pay

## 2023-06-15 ENCOUNTER — Other Ambulatory Visit (HOSPITAL_COMMUNITY): Payer: Self-pay

## 2023-06-15 MED ORDER — DICLOFENAC POTASSIUM 25 MG PO CAPS
25.0000 mg | ORAL_CAPSULE | Freq: Two times a day (BID) | ORAL | 1 refills | Status: AC
  Filled 2023-06-15: qty 60, 30d supply, fill #0

## 2023-06-15 MED ORDER — TOPIRAMATE 100 MG PO TABS
100.0000 mg | ORAL_TABLET | Freq: Two times a day (BID) | ORAL | 0 refills | Status: AC
  Filled 2023-06-15 – 2023-07-16 (×2): qty 180, 90d supply, fill #0

## 2023-06-15 MED ORDER — OXYCODONE-ACETAMINOPHEN 10-325 MG PO TABS
1.0000 | ORAL_TABLET | Freq: Every day | ORAL | 0 refills | Status: AC
  Filled 2023-06-15 (×3): qty 150, 30d supply, fill #0

## 2023-06-15 MED ORDER — OXYCODONE-ACETAMINOPHEN 10-325 MG PO TABS
1.0000 | ORAL_TABLET | Freq: Every day | ORAL | 0 refills | Status: AC
  Filled 2023-07-17 (×2): qty 150, 30d supply, fill #0

## 2023-07-16 ENCOUNTER — Other Ambulatory Visit (HOSPITAL_COMMUNITY): Payer: Self-pay

## 2023-07-17 ENCOUNTER — Other Ambulatory Visit: Payer: Self-pay

## 2023-07-17 ENCOUNTER — Other Ambulatory Visit (HOSPITAL_COMMUNITY): Payer: Self-pay

## 2023-07-26 ENCOUNTER — Other Ambulatory Visit (HOSPITAL_COMMUNITY): Payer: Self-pay

## 2023-08-10 ENCOUNTER — Other Ambulatory Visit (HOSPITAL_COMMUNITY): Payer: Self-pay

## 2023-08-10 MED ORDER — DICLOFENAC POTASSIUM 25 MG PO CAPS
25.0000 mg | ORAL_CAPSULE | Freq: Two times a day (BID) | ORAL | 1 refills | Status: AC
  Filled 2023-08-10 – 2023-08-14 (×2): qty 60, 30d supply, fill #0

## 2023-08-10 MED ORDER — OXYCODONE-ACETAMINOPHEN 10-325 MG PO TABS
1.0000 | ORAL_TABLET | Freq: Every day | ORAL | 0 refills | Status: AC
  Filled 2023-08-16 (×2): qty 150, 30d supply, fill #0

## 2023-08-10 MED ORDER — TOPIRAMATE 100 MG PO TABS
100.0000 mg | ORAL_TABLET | Freq: Two times a day (BID) | ORAL | 0 refills | Status: AC
  Filled 2023-08-10: qty 180, 90d supply, fill #0

## 2023-08-10 MED ORDER — OXYCODONE-ACETAMINOPHEN 10-325 MG PO TABS
1.0000 | ORAL_TABLET | Freq: Every day | ORAL | 0 refills | Status: DC
  Filled 2023-09-14 (×2): qty 150, 30d supply, fill #0

## 2023-08-14 ENCOUNTER — Other Ambulatory Visit (HOSPITAL_COMMUNITY): Payer: Self-pay

## 2023-08-15 ENCOUNTER — Other Ambulatory Visit (HOSPITAL_COMMUNITY): Payer: Self-pay

## 2023-08-16 ENCOUNTER — Other Ambulatory Visit (HOSPITAL_COMMUNITY): Payer: Self-pay

## 2023-08-16 MED ORDER — DICLOFENAC SODIUM 75 MG PO TBEC
75.0000 mg | DELAYED_RELEASE_TABLET | Freq: Two times a day (BID) | ORAL | 1 refills | Status: DC
  Filled 2023-08-16 – 2023-10-18 (×3): qty 60, 30d supply, fill #0
  Filled 2023-11-26 – 2023-11-27 (×2): qty 60, 30d supply, fill #1

## 2023-08-28 ENCOUNTER — Other Ambulatory Visit (HOSPITAL_COMMUNITY): Payer: Self-pay

## 2023-09-13 ENCOUNTER — Other Ambulatory Visit (HOSPITAL_COMMUNITY): Payer: Self-pay

## 2023-09-14 ENCOUNTER — Other Ambulatory Visit (HOSPITAL_COMMUNITY): Payer: Self-pay

## 2023-09-14 ENCOUNTER — Other Ambulatory Visit: Payer: Self-pay

## 2023-09-25 ENCOUNTER — Other Ambulatory Visit (HOSPITAL_COMMUNITY): Payer: Self-pay

## 2023-10-01 ENCOUNTER — Other Ambulatory Visit (HOSPITAL_COMMUNITY): Payer: Self-pay

## 2023-10-18 ENCOUNTER — Other Ambulatory Visit (HOSPITAL_COMMUNITY): Payer: Self-pay

## 2023-10-18 ENCOUNTER — Other Ambulatory Visit: Payer: Self-pay

## 2023-10-18 MED ORDER — OXYCODONE-ACETAMINOPHEN 10-325 MG PO TABS
1.0000 | ORAL_TABLET | Freq: Every day | ORAL | 0 refills | Status: AC
  Filled 2023-10-18 (×2): qty 150, 30d supply, fill #0

## 2023-10-18 MED ORDER — TOPIRAMATE 100 MG PO TABS
100.0000 mg | ORAL_TABLET | Freq: Two times a day (BID) | ORAL | 0 refills | Status: AC
  Filled 2023-10-18 (×2): qty 180, 90d supply, fill #0

## 2023-10-18 MED ORDER — OXYCODONE-ACETAMINOPHEN 10-325 MG PO TABS
1.0000 | ORAL_TABLET | Freq: Every day | ORAL | 0 refills | Status: AC
  Filled 2023-11-16 (×2): qty 150, 30d supply, fill #0

## 2023-11-15 ENCOUNTER — Other Ambulatory Visit (HOSPITAL_COMMUNITY): Payer: Self-pay

## 2023-11-16 ENCOUNTER — Other Ambulatory Visit (HOSPITAL_COMMUNITY): Payer: Self-pay

## 2023-11-16 ENCOUNTER — Other Ambulatory Visit: Payer: Self-pay

## 2023-11-26 ENCOUNTER — Other Ambulatory Visit (HOSPITAL_COMMUNITY): Payer: Self-pay

## 2023-11-27 ENCOUNTER — Other Ambulatory Visit (HOSPITAL_COMMUNITY): Payer: Self-pay

## 2023-12-18 ENCOUNTER — Other Ambulatory Visit (HOSPITAL_COMMUNITY): Payer: Self-pay

## 2023-12-18 ENCOUNTER — Other Ambulatory Visit: Payer: Self-pay

## 2023-12-18 MED ORDER — TOPIRAMATE 100 MG PO TABS
100.0000 mg | ORAL_TABLET | Freq: Two times a day (BID) | ORAL | 0 refills | Status: AC
  Filled 2023-12-18: qty 180, 90d supply, fill #0

## 2023-12-18 MED ORDER — OXYCODONE-ACETAMINOPHEN 10-325 MG PO TABS
1.0000 | ORAL_TABLET | Freq: Every day | ORAL | 0 refills | Status: AC
  Filled 2024-01-17: qty 150, 30d supply, fill #0

## 2023-12-18 MED ORDER — DICLOFENAC SODIUM 75 MG PO TBEC
75.0000 mg | DELAYED_RELEASE_TABLET | Freq: Two times a day (BID) | ORAL | 1 refills | Status: AC
  Filled 2023-12-18 – 2023-12-20 (×2): qty 60, 30d supply, fill #0

## 2023-12-18 MED ORDER — OXYCODONE-ACETAMINOPHEN 10-325 MG PO TABS
1.0000 | ORAL_TABLET | Freq: Every day | ORAL | 0 refills | Status: AC
  Filled 2023-12-18 (×2): qty 150, 30d supply, fill #0

## 2023-12-18 MED ORDER — PREDNISONE 10 MG PO TABS
ORAL_TABLET | ORAL | 0 refills | Status: AC
  Filled 2023-12-18: qty 21, 6d supply, fill #0

## 2023-12-20 ENCOUNTER — Other Ambulatory Visit (HOSPITAL_COMMUNITY): Payer: Self-pay

## 2023-12-20 ENCOUNTER — Other Ambulatory Visit: Payer: Self-pay

## 2023-12-31 ENCOUNTER — Other Ambulatory Visit (HOSPITAL_COMMUNITY): Payer: Self-pay

## 2024-01-16 ENCOUNTER — Other Ambulatory Visit (HOSPITAL_COMMUNITY): Payer: Self-pay

## 2024-01-17 ENCOUNTER — Other Ambulatory Visit (HOSPITAL_COMMUNITY): Payer: Self-pay

## 2024-02-18 ENCOUNTER — Other Ambulatory Visit (HOSPITAL_COMMUNITY): Payer: Self-pay

## 2024-02-18 ENCOUNTER — Other Ambulatory Visit: Payer: Self-pay

## 2024-02-18 MED ORDER — OXYCODONE-ACETAMINOPHEN 10-325 MG PO TABS
1.0000 | ORAL_TABLET | Freq: Every day | ORAL | 0 refills | Status: AC | PRN
  Filled 2024-03-19: qty 150, 30d supply, fill #0

## 2024-02-18 MED ORDER — OXYCODONE-ACETAMINOPHEN 10-325 MG PO TABS
1.0000 | ORAL_TABLET | Freq: Every day | ORAL | 0 refills | Status: AC | PRN
  Filled 2024-02-18 (×2): qty 150, 30d supply, fill #0

## 2024-02-18 MED ORDER — DICLOFENAC SODIUM 75 MG PO TBEC
75.0000 mg | DELAYED_RELEASE_TABLET | Freq: Two times a day (BID) | ORAL | 1 refills | Status: AC
  Filled 2024-02-18 (×2): qty 60, 30d supply, fill #0

## 2024-02-18 MED ORDER — TOPIRAMATE 100 MG PO TABS
100.0000 mg | ORAL_TABLET | Freq: Two times a day (BID) | ORAL | 0 refills | Status: AC
  Filled 2024-02-18 (×2): qty 180, 90d supply, fill #0

## 2024-03-07 ENCOUNTER — Other Ambulatory Visit: Payer: Self-pay

## 2024-03-10 ENCOUNTER — Other Ambulatory Visit: Payer: Self-pay

## 2024-03-17 ENCOUNTER — Other Ambulatory Visit (HOSPITAL_COMMUNITY): Payer: Self-pay

## 2024-03-19 ENCOUNTER — Other Ambulatory Visit (HOSPITAL_COMMUNITY): Payer: Self-pay

## 2024-04-14 ENCOUNTER — Other Ambulatory Visit (HOSPITAL_COMMUNITY): Payer: Self-pay

## 2024-04-14 MED ORDER — OXYCODONE-ACETAMINOPHEN 10-325 MG PO TABS
1.0000 | ORAL_TABLET | Freq: Every day | ORAL | 0 refills | Status: AC
  Filled 2024-04-18 (×2): qty 150, 30d supply, fill #0

## 2024-04-14 MED ORDER — DICLOFENAC SODIUM 75 MG PO TBEC
75.0000 mg | DELAYED_RELEASE_TABLET | Freq: Two times a day (BID) | ORAL | 1 refills | Status: AC
  Filled 2024-04-14: qty 60, 30d supply, fill #0

## 2024-04-14 MED ORDER — TOPIRAMATE 100 MG PO TABS
100.0000 mg | ORAL_TABLET | Freq: Two times a day (BID) | ORAL | 0 refills | Status: AC
  Filled 2024-04-14: qty 180, 90d supply, fill #0

## 2024-04-14 MED ORDER — OXYCODONE-ACETAMINOPHEN 10-325 MG PO TABS
1.0000 | ORAL_TABLET | Freq: Every day | ORAL | 0 refills | Status: AC
  Filled 2024-05-16: qty 150, 30d supply, fill #0

## 2024-04-16 ENCOUNTER — Other Ambulatory Visit (HOSPITAL_COMMUNITY): Payer: Self-pay

## 2024-04-18 ENCOUNTER — Other Ambulatory Visit (HOSPITAL_COMMUNITY): Payer: Self-pay

## 2024-04-18 ENCOUNTER — Other Ambulatory Visit: Payer: Self-pay

## 2024-05-12 ENCOUNTER — Other Ambulatory Visit: Payer: Self-pay

## 2024-05-12 ENCOUNTER — Other Ambulatory Visit (HOSPITAL_COMMUNITY): Payer: Self-pay

## 2024-05-13 ENCOUNTER — Other Ambulatory Visit (HOSPITAL_COMMUNITY): Payer: Self-pay

## 2024-05-14 ENCOUNTER — Other Ambulatory Visit (HOSPITAL_COMMUNITY): Payer: Self-pay

## 2024-05-16 ENCOUNTER — Other Ambulatory Visit (HOSPITAL_COMMUNITY): Payer: Self-pay

## 2024-06-18 ENCOUNTER — Other Ambulatory Visit (HOSPITAL_COMMUNITY): Payer: Self-pay

## 2024-06-18 MED ORDER — OXYCODONE-ACETAMINOPHEN 10-325 MG PO TABS: 1.0000 | ORAL_TABLET | Freq: Every day | ORAL | 0 refills | Status: AC

## 2024-06-18 MED ORDER — OXYCODONE-ACETAMINOPHEN 10-325 MG PO TABS
1.0000 | ORAL_TABLET | Freq: Every day | ORAL | 0 refills | Status: AC
  Filled 2024-06-18: qty 150, 30d supply, fill #0

## 2024-06-18 MED ORDER — TOPIRAMATE 100 MG PO TABS
100.0000 mg | ORAL_TABLET | Freq: Two times a day (BID) | ORAL | 0 refills | Status: AC
  Filled 2024-06-18: qty 180, 90d supply, fill #0
